# Patient Record
Sex: Female | Born: 1950
Health system: Southern US, Community
[De-identification: ages and names within clinical notes are randomized; demographics above are authoritative.]

## PROBLEM LIST (undated history)

## (undated) DIAGNOSIS — L309 Dermatitis, unspecified: Secondary | ICD-10-CM

## (undated) HISTORY — PX: COLONOSCOPY: SHX174

---

## 2009-06-26 ENCOUNTER — Encounter: Admission: RE | Admit: 2009-06-26 | Discharge: 2009-06-26 | Payer: Self-pay | Admitting: Family Medicine

## 2017-08-02 DIAGNOSIS — H2513 Age-related nuclear cataract, bilateral: Secondary | ICD-10-CM | POA: Diagnosis not present

## 2017-08-02 DIAGNOSIS — H353111 Nonexudative age-related macular degeneration, right eye, early dry stage: Secondary | ICD-10-CM | POA: Diagnosis not present

## 2017-08-02 DIAGNOSIS — H353221 Exudative age-related macular degeneration, left eye, with active choroidal neovascularization: Secondary | ICD-10-CM | POA: Diagnosis not present

## 2017-08-02 DIAGNOSIS — H43812 Vitreous degeneration, left eye: Secondary | ICD-10-CM | POA: Diagnosis not present

## 2018-04-04 DIAGNOSIS — H353222 Exudative age-related macular degeneration, left eye, with inactive choroidal neovascularization: Secondary | ICD-10-CM | POA: Diagnosis not present

## 2018-04-04 DIAGNOSIS — H2513 Age-related nuclear cataract, bilateral: Secondary | ICD-10-CM | POA: Diagnosis not present

## 2018-04-04 DIAGNOSIS — Z0100 Encounter for examination of eyes and vision without abnormal findings: Secondary | ICD-10-CM | POA: Diagnosis not present

## 2018-05-11 DIAGNOSIS — R69 Illness, unspecified: Secondary | ICD-10-CM | POA: Diagnosis not present

## 2018-06-27 DIAGNOSIS — L7211 Pilar cyst: Secondary | ICD-10-CM | POA: Diagnosis not present

## 2018-12-19 DIAGNOSIS — H353221 Exudative age-related macular degeneration, left eye, with active choroidal neovascularization: Secondary | ICD-10-CM | POA: Diagnosis not present

## 2018-12-19 DIAGNOSIS — H43813 Vitreous degeneration, bilateral: Secondary | ICD-10-CM | POA: Diagnosis not present

## 2018-12-19 DIAGNOSIS — H353111 Nonexudative age-related macular degeneration, right eye, early dry stage: Secondary | ICD-10-CM | POA: Diagnosis not present

## 2019-01-16 DIAGNOSIS — H353221 Exudative age-related macular degeneration, left eye, with active choroidal neovascularization: Secondary | ICD-10-CM | POA: Diagnosis not present

## 2019-04-26 DIAGNOSIS — L7212 Trichodermal cyst: Secondary | ICD-10-CM | POA: Diagnosis not present

## 2019-04-26 DIAGNOSIS — L7211 Pilar cyst: Secondary | ICD-10-CM | POA: Diagnosis not present

## 2019-11-07 ENCOUNTER — Other Ambulatory Visit: Payer: Self-pay

## 2019-11-07 DIAGNOSIS — Z1231 Encounter for screening mammogram for malignant neoplasm of breast: Secondary | ICD-10-CM

## 2020-02-13 DIAGNOSIS — H353222 Exudative age-related macular degeneration, left eye, with inactive choroidal neovascularization: Secondary | ICD-10-CM | POA: Diagnosis not present

## 2020-02-13 DIAGNOSIS — H2513 Age-related nuclear cataract, bilateral: Secondary | ICD-10-CM | POA: Diagnosis not present

## 2020-03-01 HISTORY — PX: EYE SURGERY: SHX253

## 2020-03-24 DIAGNOSIS — H2512 Age-related nuclear cataract, left eye: Secondary | ICD-10-CM | POA: Diagnosis not present

## 2020-03-24 DIAGNOSIS — H25043 Posterior subcapsular polar age-related cataract, bilateral: Secondary | ICD-10-CM | POA: Diagnosis not present

## 2020-03-24 DIAGNOSIS — H2513 Age-related nuclear cataract, bilateral: Secondary | ICD-10-CM | POA: Diagnosis not present

## 2020-03-24 DIAGNOSIS — H25013 Cortical age-related cataract, bilateral: Secondary | ICD-10-CM | POA: Diagnosis not present

## 2020-03-24 DIAGNOSIS — H18413 Arcus senilis, bilateral: Secondary | ICD-10-CM | POA: Diagnosis not present

## 2020-05-01 DIAGNOSIS — H2512 Age-related nuclear cataract, left eye: Secondary | ICD-10-CM | POA: Diagnosis not present

## 2020-05-01 DIAGNOSIS — H2511 Age-related nuclear cataract, right eye: Secondary | ICD-10-CM | POA: Diagnosis not present

## 2020-05-29 DIAGNOSIS — H2511 Age-related nuclear cataract, right eye: Secondary | ICD-10-CM | POA: Diagnosis not present

## 2020-05-29 DIAGNOSIS — H5211 Myopia, right eye: Secondary | ICD-10-CM | POA: Diagnosis not present

## 2020-06-11 DIAGNOSIS — R1010 Upper abdominal pain, unspecified: Secondary | ICD-10-CM | POA: Diagnosis not present

## 2020-06-11 DIAGNOSIS — R109 Unspecified abdominal pain: Secondary | ICD-10-CM | POA: Diagnosis not present

## 2020-06-11 DIAGNOSIS — Z23 Encounter for immunization: Secondary | ICD-10-CM | POA: Diagnosis not present

## 2020-06-15 ENCOUNTER — Other Ambulatory Visit: Payer: Self-pay | Admitting: Family Medicine

## 2020-06-15 DIAGNOSIS — R1084 Generalized abdominal pain: Secondary | ICD-10-CM

## 2020-06-16 ENCOUNTER — Other Ambulatory Visit: Payer: Self-pay

## 2020-06-16 ENCOUNTER — Ambulatory Visit (INDEPENDENT_AMBULATORY_CARE_PROVIDER_SITE_OTHER): Payer: Medicare HMO

## 2020-06-16 DIAGNOSIS — R109 Unspecified abdominal pain: Secondary | ICD-10-CM | POA: Diagnosis not present

## 2020-06-16 DIAGNOSIS — K8020 Calculus of gallbladder without cholecystitis without obstruction: Secondary | ICD-10-CM | POA: Diagnosis not present

## 2020-06-16 DIAGNOSIS — K8080 Other cholelithiasis without obstruction: Secondary | ICD-10-CM

## 2020-06-16 DIAGNOSIS — R1084 Generalized abdominal pain: Secondary | ICD-10-CM

## 2020-06-24 DIAGNOSIS — K8020 Calculus of gallbladder without cholecystitis without obstruction: Secondary | ICD-10-CM | POA: Diagnosis not present

## 2020-06-24 DIAGNOSIS — E782 Mixed hyperlipidemia: Secondary | ICD-10-CM | POA: Diagnosis not present

## 2020-06-30 DIAGNOSIS — Z0100 Encounter for examination of eyes and vision without abnormal findings: Secondary | ICD-10-CM | POA: Diagnosis not present

## 2020-07-01 DIAGNOSIS — K7689 Other specified diseases of liver: Secondary | ICD-10-CM

## 2020-07-01 HISTORY — DX: Other specified diseases of liver: K76.89

## 2020-07-23 DIAGNOSIS — K8020 Calculus of gallbladder without cholecystitis without obstruction: Secondary | ICD-10-CM | POA: Diagnosis not present

## 2020-07-28 ENCOUNTER — Other Ambulatory Visit: Payer: Self-pay | Admitting: Surgery

## 2020-07-28 ENCOUNTER — Other Ambulatory Visit (HOSPITAL_COMMUNITY): Payer: Self-pay | Admitting: Surgery

## 2020-07-28 DIAGNOSIS — K8020 Calculus of gallbladder without cholecystitis without obstruction: Secondary | ICD-10-CM

## 2020-08-10 ENCOUNTER — Ambulatory Visit (HOSPITAL_COMMUNITY)
Admission: RE | Admit: 2020-08-10 | Discharge: 2020-08-10 | Disposition: A | Discharge status: Home / Self Care | Payer: Medicare HMO | Source: Ambulatory Visit | Attending: Surgery | Admitting: Surgery

## 2020-08-10 ENCOUNTER — Other Ambulatory Visit: Payer: Self-pay

## 2020-08-10 DIAGNOSIS — Z8719 Personal history of other diseases of the digestive system: Secondary | ICD-10-CM | POA: Diagnosis not present

## 2020-08-10 DIAGNOSIS — K8020 Calculus of gallbladder without cholecystitis without obstruction: Secondary | ICD-10-CM | POA: Diagnosis not present

## 2020-08-10 DIAGNOSIS — R1011 Right upper quadrant pain: Secondary | ICD-10-CM | POA: Diagnosis not present

## 2020-08-10 MED ORDER — MORPHINE SULFATE (PF) 4 MG/ML IV SOLN: 3.0000 mg | Freq: Once | INTRAVENOUS | Status: DC

## 2020-08-10 MED ORDER — MORPHINE SULFATE (PF) 4 MG/ML IV SOLN
INTRAVENOUS | Status: AC
  Filled 2020-08-10: qty 1

## 2020-08-10 MED ORDER — MORPHINE BOLUS VIA INFUSION: 3.0000 mg | Freq: Once | INTRAVENOUS | Status: DC

## 2020-08-10 MED ORDER — TECHNETIUM TC 99M MEBROFENIN IV KIT
5.0000 | PACK | Freq: Once | INTRAVENOUS | Status: AC | PRN
  Administered 2020-08-10: 5 via INTRAVENOUS

## 2020-08-10 NOTE — Progress Notes (Signed)
Radiology RN called to Nuclear Medicine to administer 3 mg morphine IV.  BP prior to administration 99/55 HR 76. This RN called Dr. Clovis Riley with [facility]iology, notified of BP prior to morphine injection. Patient states her BP systolic is usually above 120s.  Medication not given at this time.  NM Techologist aware.

## 2020-08-12 ENCOUNTER — Ambulatory Visit: Payer: Self-pay | Admitting: Surgery

## 2020-08-13 NOTE — Pre-Procedure Instructions (Signed)
Your procedure is scheduled on Fri., Jan. 21, 2022 from 9:30AM-11:00AM.  Report to [facility] Entrance "A" at 7:30AM.  Call this number if you have problems the morning of surgery:  [phone]   Remember:  Do not eat or drink after midnight on Jan. 20th    Take these medicines the morning of surgery with A SIP OF WATER: NONE  As of today, STOP taking all Aspirin (unless instructed by your doctor) and Other Aspirin containing products, Vitamins, Fish oils, and Herbal medications. Also stop all NSAIDS i.e. Advil, Ibuprofen, Motrin, Aleve, Anaprox, Naproxen, BC, Goody Powders, and all Supplements.   No Smoking of any kind, Tobacco/Vaping, or Alcohol products 24 hours prior to your procedure. If you use a Cpap at night, you may bring all equipment for your overnight stay.     Do not wear jewelry, make-up or nail polish.  Do not wear lotions, powders, or perfumes, or deodorant.  Do not shave 48 hours prior to surgery.    Do not bring valuables to the hospital.  [facility] is not responsible for any belongings or valuables.  Contacts, dentures or bridgework may not be worn into surgery.    For patients admitted to the hospital, discharge time will be determined by your treatment team.  Patients discharged the day of surgery will not be allowed to drive home, and someone age 71 and over needs to stay with them for 24 hours.  Special instructions:  [facility]- Preparing For Surgery  Before surgery, you can play an important role. Because skin is not sterile, your skin needs to be as free of germs as possible. You can reduce the number of germs on your skin by washing with CHG (chlorahexidine gluconate) Soap before surgery.  CHG is an antiseptic cleaner which kills germs and bonds with the skin to continue killing germs even after washing.    Oral Hygiene is also important to reduce your risk of infection.  Remember - BRUSH YOUR TEETH THE MORNING OF SURGERY WITH YOUR REGULAR  TOOTHPASTE  Please do not use if you have an allergy to CHG or antibacterial soaps. If your skin becomes reddened/irritated stop using the CHG.  Do not shave (including legs and underarms) for at least 48 hours prior to first CHG shower. It is OK to shave your face.  Please follow these instructions carefully.   1. Shower the NIGHT BEFORE SURGERY and the MORNING OF SURGERY with CHG.   2. If you chose to wash your hair, wash your hair first as usual with your normal shampoo.  3. After you shampoo, rinse your hair and body thoroughly to remove the shampoo.  4. Use CHG as you would any other liquid soap. You can apply CHG directly to the skin and wash gently with a scrungie or a clean washcloth.   5. Apply the CHG Soap to your body ONLY FROM THE NECK DOWN.  Do not use on open wounds or open sores. Avoid contact with your eyes, ears, mouth and genitals (private parts). Wash Face and genitals (private parts)  with your normal soap.  6. Wash thoroughly, paying special attention to the area where your surgery will be performed.  7. Thoroughly rinse your body with warm water from the neck down.  8. DO NOT shower/wash with your normal soap after using and rinsing off the CHG Soap.  9. Pat yourself dry with a CLEAN TOWEL.  10. Wear CLEAN PAJAMAS to bed the night before surgery, wear comfortable  clothes the morning of surgery  11. Place CLEAN SHEETS on your bed the night of your first shower and DO NOT SLEEP WITH PETS.  Day of Surgery: Do not apply any deodorants/lotions.  Please wear clean clothes to the hospital/surgery center.   Remember to brush your teeth WITH YOUR REGULAR TOOTHPASTE.  Please read over the following fact sheets that you were given.

## 2020-08-14 ENCOUNTER — Encounter (HOSPITAL_COMMUNITY): Payer: Self-pay

## 2020-08-14 ENCOUNTER — Other Ambulatory Visit: Payer: Self-pay

## 2020-08-14 ENCOUNTER — Encounter (HOSPITAL_COMMUNITY)
Admission: RE | Admit: 2020-08-14 | Discharge: 2020-08-14 | Disposition: A | Discharge status: Home / Self Care | Payer: Medicare HMO | Source: Ambulatory Visit | Attending: Surgery | Admitting: Surgery

## 2020-08-14 DIAGNOSIS — K810 Acute cholecystitis: Secondary | ICD-10-CM | POA: Diagnosis not present

## 2020-08-14 DIAGNOSIS — Z87891 Personal history of nicotine dependence: Secondary | ICD-10-CM | POA: Diagnosis not present

## 2020-08-14 DIAGNOSIS — Z01812 Encounter for preprocedural laboratory examination: Secondary | ICD-10-CM | POA: Diagnosis not present

## 2020-08-14 HISTORY — DX: Dermatitis, unspecified: L30.9

## 2020-08-14 LAB — CBC
HCT: 44 % (ref 36.0–46.0)
Hemoglobin: 13.8 g/dL (ref 12.0–15.0)
MCH: 29.9 pg (ref 26.0–34.0)
MCHC: 31.4 g/dL (ref 30.0–36.0)
MCV: 95.2 fL (ref 80.0–100.0)
Platelets: 342 10*3/uL (ref 150–400)
RBC: 4.62 MIL/uL (ref 3.87–5.11)
RDW: 12.7 % (ref 11.5–15.5)
WBC: 9 10*3/uL (ref 4.0–10.5)
nRBC: 0 % (ref 0.0–0.2)

## 2020-08-14 NOTE — Progress Notes (Addendum)
Anesthesia Chart Review:  Case: [id number] Date/Time: 08/21/20 0915   Procedure: LAPAROSCOPIC CHOLECYSTECTOMY (N/A )   Anesthesia type: General   Pre-op diagnosis: acute cholecystitis   Location: [facility] OR ROOM 08 / [place] OR   Surgeons: Jesusita Oka, MD      DISCUSSION: Patient is a 70 year old female scheduled for the above procedure.  History includes former smoker, eczema, cholecystitis (with cystic duct obstruction 08/01/20), echogenic liver with 15 mm lesion (consider cyst 06/16/20 [place], consider [place] f/u in 6 months). BMI is consistent with obesity.   Preoperative CBC WNL. Unremarkable CMET at [facility] on 06/11/20.   Preoperative COVID-19 test is scheduled for 08/18/2020.  Anesthesia team to evaluate on the day of surgery.  I will plan to update note if any additional pertinent records received from [facility]. (UPDATE 08/19/20 12:00 PM: 06/24/20 primary care notes reviewed. Patient encouraged to follow through with general surgery evaluation for gallbladder disease. 08/18/20 COVID-19 test negative.)   VS: BP 122/81   Pulse 98   Temp 36.9 C (Oral)   Resp 19   Ht 5\' 1"  (1.549 m)   Wt 83.3 kg   SpO2 99%   BMI 34.71 kg/m    PROVIDERS: Kathyrn Lass, MD is PCP    LABS: Labs reviewed: Acceptable for surgery. She has comparison labs from 06/11/20 in [facility] with CMET then showing glucose 73, Cr 0.60, Na 139, K 4.2, Ca 9.7, TBil 0.5, ALP 89, AST 20, ALT 22, lipase 32. (all labs ordered are listed, but only abnormal results are displayed)  Labs Reviewed  CBC     IMAGES: NM Hepatobiliary Scan 08/10/20: IMPRESSION: 1. Nonvisualization of the gallbladder compatible with cystic duct obstruction due to cholecystitis. 2. These results will be called to the ordering clinician or representative by the Radiologist Assistant, and communication documented in the PACS or [employer].  [place] Abd RUQ 06/16/20: IMPRESSION: 1. Cholelithiasis. 2. Minimally dilated  common bile duct. 3. Echogenic liver, nonspecific though may reflect steatosis. 15 mm lesion in the right hepatic lobe which may reflect a mildly complex cyst. Consider follow-up right upper quadrant ultrasound in 6 months to assess stability.   EKG: N/A   CV: N/A  Past Medical History:  Diagnosis Date  . Eczema   . Liver cyst 07/2020   inconclusive via ultrasound    Past Surgical History:  Procedure Laterality Date  . COLONOSCOPY    . EYE SURGERY Bilateral 03/2020   cataract removal    MEDICATIONS: . Cholecalciferol (DIALYVITE VITAMIN D 5000) 125 MCG (5000 UT) capsule  . Multiple Vitamin (MULTIVITAMIN WITH MINERALS) TABS tablet  . naproxen sodium (ALEVE) 220 MG tablet   No current facility-administered medications for this encounter.   . morphine 4 MG/ML injection 3 mg    Myra Gianotti, PA-C Surgical Short Stay/Anesthesiology [facility] Phone [phone] [facility] Phone [phone] 08/14/2020 2:30 PM

## 2020-08-14 NOTE — Anesthesia Preprocedure Evaluation (Addendum)
Anesthesia Evaluation  Patient identified by MRN, date of birth, ID band Patient awake    Reviewed: Allergy & Precautions, H&P , NPO status , Patient's Chart, lab work & pertinent test results  Airway Mallampati: II   Neck ROM: full    Dental   Pulmonary former smoker,    breath sounds clear to auscultation       Cardiovascular negative cardio ROS   Rhythm:regular Rate:Normal     Neuro/Psych    GI/Hepatic Acute cholecystitis   Endo/Other  obese  Renal/GU      Musculoskeletal   Abdominal   Peds  Hematology   Anesthesia Other Findings   Reproductive/Obstetrics                            Anesthesia Physical Anesthesia Plan  ASA: II  Anesthesia Plan: General   Post-op Pain Management:    Induction: Intravenous  PONV Risk Score and Plan: 3 and Ondansetron, Dexamethasone and Treatment may vary due to age or medical condition  Airway Management Planned: Oral ETT  Additional Equipment:   Intra-op Plan:   Post-operative Plan: Extubation in OR  Informed Consent: I have reviewed the patients History and Physical, chart, labs and discussed the procedure including the risks, benefits and alternatives for the proposed anesthesia with the patient or authorized representative who has indicated his/her understanding and acceptance.     Dental advisory given  Plan Discussed with: CRNA, Anesthesiologist and Surgeon  Anesthesia Plan Comments: (PAT note written by Myra Gianotti, PA-C. )       Anesthesia Quick Evaluation

## 2020-08-14 NOTE — Progress Notes (Signed)
PCP - Kathyrn Lass, MD, with [facility] Per patient, she has not seen Dr. Sabra Heck in 5 years but will re-establish care this year. Records requested. Cardiologist - Denies  PPM/ICD - Denies  Chest x-ray - N/A EKG - N/A Stress Test - Denies ECHO - Denies Cardiac Cath - Denies  Sleep Study - Denies  Patient denies being diabetic.  Blood Thinner Instructions: N/A Aspirin Instructions: N/A Patient also instructed to stop taking naproxen sodium (ALEVE) as of today. Further instructed pt to reach out to Dr. Bobbye Morton for alternate prescription for pain relief.  ERAS Protcol - N/A PRE-SURGERY Ensure or G2- N/A  COVID TEST- 08/18/20   Anesthesia review: Yes, PCP records requested for review.  Patient denies shortness of breath, fever, cough and chest pain at PAT appointment   All instructions explained to the patient, with a verbal understanding of the material. Patient agrees to go over the instructions while at home for a better understanding. Patient also instructed to self quarantine after being tested for COVID-19. The opportunity to ask questions was provided.

## 2020-08-18 ENCOUNTER — Other Ambulatory Visit (HOSPITAL_COMMUNITY)
Admission: RE | Admit: 2020-08-18 | Discharge: 2020-08-18 | Disposition: A | Discharge status: Home / Self Care | Payer: Medicare HMO | Source: Ambulatory Visit | Attending: Surgery | Admitting: Surgery

## 2020-08-18 DIAGNOSIS — Z01818 Encounter for other preprocedural examination: Secondary | ICD-10-CM | POA: Insufficient documentation

## 2020-08-18 DIAGNOSIS — Z20822 Contact with and (suspected) exposure to covid-19: Secondary | ICD-10-CM | POA: Insufficient documentation

## 2020-08-18 LAB — SARS CORONAVIRUS 2 (TAT 6-24 HRS): SARS Coronavirus 2: NEGATIVE

## 2020-08-21 ENCOUNTER — Ambulatory Visit (HOSPITAL_COMMUNITY): Payer: Medicare HMO | Admitting: Vascular Surgery

## 2020-08-21 ENCOUNTER — Encounter (HOSPITAL_COMMUNITY)
Admission: RE | Disposition: A | Discharge status: Home / Self Care | Payer: Self-pay | Source: Home / Self Care | Attending: Surgery

## 2020-08-21 ENCOUNTER — Encounter (HOSPITAL_COMMUNITY): Payer: Self-pay | Admitting: Surgery

## 2020-08-21 ENCOUNTER — Inpatient Hospital Stay (HOSPITAL_COMMUNITY)
Admission: RE | Admit: 2020-08-21 | Discharge: 2020-09-01 | DRG: 418 | Disposition: A | Discharge status: Home / Self Care | Payer: Medicare HMO | Attending: Surgery | Admitting: Surgery

## 2020-08-21 ENCOUNTER — Other Ambulatory Visit: Payer: Self-pay

## 2020-08-21 DIAGNOSIS — Y839 Surgical procedure, unspecified as the cause of abnormal reaction of the patient, or of later complication, without mention of misadventure at the time of the procedure: Secondary | ICD-10-CM | POA: Diagnosis not present

## 2020-08-21 DIAGNOSIS — T8183XA Persistent postprocedural fistula, initial encounter: Secondary | ICD-10-CM | POA: Diagnosis not present

## 2020-08-21 DIAGNOSIS — K769 Liver disease, unspecified: Secondary | ICD-10-CM | POA: Diagnosis present

## 2020-08-21 DIAGNOSIS — N179 Acute kidney failure, unspecified: Secondary | ICD-10-CM | POA: Diagnosis not present

## 2020-08-21 DIAGNOSIS — K660 Peritoneal adhesions (postprocedural) (postinfection): Secondary | ICD-10-CM | POA: Diagnosis present

## 2020-08-21 DIAGNOSIS — J9690 Respiratory failure, unspecified, unspecified whether with hypoxia or hypercapnia: Secondary | ICD-10-CM

## 2020-08-21 DIAGNOSIS — E669 Obesity, unspecified: Secondary | ICD-10-CM | POA: Diagnosis present

## 2020-08-21 DIAGNOSIS — Z87891 Personal history of nicotine dependence: Secondary | ICD-10-CM

## 2020-08-21 DIAGNOSIS — C23 Malignant neoplasm of gallbladder: Secondary | ICD-10-CM | POA: Diagnosis not present

## 2020-08-21 DIAGNOSIS — K810 Acute cholecystitis: Secondary | ICD-10-CM | POA: Diagnosis not present

## 2020-08-21 DIAGNOSIS — Z6841 Body Mass Index (BMI) 40.0 and over, adult: Secondary | ICD-10-CM | POA: Diagnosis not present

## 2020-08-21 DIAGNOSIS — R109 Unspecified abdominal pain: Secondary | ICD-10-CM

## 2020-08-21 DIAGNOSIS — K828 Other specified diseases of gallbladder: Secondary | ICD-10-CM | POA: Diagnosis not present

## 2020-08-21 DIAGNOSIS — K567 Ileus, unspecified: Secondary | ICD-10-CM | POA: Diagnosis not present

## 2020-08-21 DIAGNOSIS — I96 Gangrene, not elsewhere classified: Secondary | ICD-10-CM | POA: Diagnosis not present

## 2020-08-21 DIAGNOSIS — Z9049 Acquired absence of other specified parts of digestive tract: Secondary | ICD-10-CM

## 2020-08-21 DIAGNOSIS — Z66 Do not resuscitate: Secondary | ICD-10-CM | POA: Diagnosis not present

## 2020-08-21 DIAGNOSIS — Y733 Surgical instruments, materials and gastroenterology and urology devices (including sutures) associated with adverse incidents: Secondary | ICD-10-CM | POA: Diagnosis not present

## 2020-08-21 DIAGNOSIS — Z888 Allergy status to other drugs, medicaments and biological substances status: Secondary | ICD-10-CM

## 2020-08-21 DIAGNOSIS — Z515 Encounter for palliative care: Secondary | ICD-10-CM

## 2020-08-21 DIAGNOSIS — C801 Malignant (primary) neoplasm, unspecified: Secondary | ICD-10-CM

## 2020-08-21 DIAGNOSIS — L02211 Cutaneous abscess of abdominal wall: Secondary | ICD-10-CM

## 2020-08-21 DIAGNOSIS — K316 Fistula of stomach and duodenum: Secondary | ICD-10-CM | POA: Diagnosis present

## 2020-08-21 DIAGNOSIS — Z7689 Persons encountering health services in other specified circumstances: Secondary | ICD-10-CM

## 2020-08-21 DIAGNOSIS — Z7189 Other specified counseling: Secondary | ICD-10-CM

## 2020-08-21 HISTORY — PX: CHOLECYSTECTOMY: SHX55

## 2020-08-21 LAB — COMPREHENSIVE METABOLIC PANEL
ALT: 86 U/L — ABNORMAL HIGH (ref 0–44)
AST: 104 U/L — ABNORMAL HIGH (ref 15–41)
Albumin: 2.9 g/dL — ABNORMAL LOW (ref 3.5–5.0)
Alkaline Phosphatase: 167 U/L — ABNORMAL HIGH (ref 38–126)
Anion gap: 12 (ref 5–15)
BUN: 10 mg/dL (ref 8–23)
CO2: 21 mmol/L — ABNORMAL LOW (ref 22–32)
Calcium: 8.7 mg/dL — ABNORMAL LOW (ref 8.9–10.3)
Chloride: 103 mmol/L (ref 98–111)
Creatinine, Ser: 0.67 mg/dL (ref 0.44–1.00)
GFR, Estimated: >60 mL/min (ref >60)
Glucose, Bld: 152 mg/dL — ABNORMAL HIGH (ref 70–99)
Potassium: 3.6 mmol/L (ref 3.5–5.1)
Sodium: 136 mmol/L (ref 135–145)
Total Bilirubin: 1.2 mg/dL (ref 0.3–1.2)
Total Protein: 6.7 g/dL (ref 6.5–8.1)

## 2020-08-21 LAB — CBC
HCT: 41 % (ref 36.0–46.0)
Hemoglobin: 13.3 g/dL (ref 12.0–15.0)
MCH: 29.7 pg (ref 26.0–34.0)
MCHC: 32.4 g/dL (ref 30.0–36.0)
MCV: 91.5 fL (ref 80.0–100.0)
Platelets: 302 10*3/uL (ref 150–400)
RBC: 4.48 MIL/uL (ref 3.87–5.11)
RDW: 12.4 % (ref 11.5–15.5)
WBC: 14 10*3/uL — ABNORMAL HIGH (ref 4.0–10.5)
nRBC: 0 % (ref 0.0–0.2)

## 2020-08-21 LAB — HIV ANTIBODY (ROUTINE TESTING W REFLEX): HIV Screen 4th Generation wRfx: NONREACTIVE

## 2020-08-21 SURGERY — LAPAROSCOPIC CHOLECYSTECTOMY
Anesthesia: General

## 2020-08-21 MED ORDER — ACETAMINOPHEN 500 MG PO TABS
1000.0000 mg | ORAL_TABLET | Freq: Four times a day (QID) | ORAL | Status: DC
  Administered 2020-08-21 – 2020-08-23 (×8): 1000 mg via ORAL
  Filled 2020-08-21 (×7): qty 2

## 2020-08-21 MED ORDER — CHLORHEXIDINE GLUCONATE 0.12 % MT SOLN
15.0000 mL | Freq: Once | OROMUCOSAL | Status: AC
  Administered 2020-08-21: 15 mL via OROMUCOSAL
  Filled 2020-08-21: qty 15

## 2020-08-21 MED ORDER — LIDOCAINE 2% (20 MG/ML) 5 ML SYRINGE
INTRAMUSCULAR | Status: DC | PRN
  Administered 2020-08-21: 50 mg via INTRAVENOUS

## 2020-08-21 MED ORDER — PROPOFOL 10 MG/ML IV BOLUS
INTRAVENOUS | Status: AC
  Filled 2020-08-21: qty 20

## 2020-08-21 MED ORDER — KETOROLAC TROMETHAMINE 15 MG/ML IJ SOLN
15.0000 mg | Freq: Four times a day (QID) | INTRAMUSCULAR | Status: DC
  Administered 2020-08-21: 15 mg via INTRAVENOUS
  Filled 2020-08-21: qty 1

## 2020-08-21 MED ORDER — DOCUSATE SODIUM 100 MG PO CAPS
100.0000 mg | ORAL_CAPSULE | Freq: Two times a day (BID) | ORAL | Status: DC
  Administered 2020-08-21 – 2020-08-23 (×4): 100 mg via ORAL
  Filled 2020-08-21 (×4): qty 1

## 2020-08-21 MED ORDER — FENTANYL CITRATE (PF) 250 MCG/5ML IJ SOLN
INTRAMUSCULAR | Status: AC
  Filled 2020-08-21: qty 5

## 2020-08-21 MED ORDER — LIDOCAINE 2% (20 MG/ML) 5 ML SYRINGE
INTRAMUSCULAR | Status: AC
  Filled 2020-08-21: qty 5

## 2020-08-21 MED ORDER — MIDAZOLAM HCL 5 MG/5ML IJ SOLN
INTRAMUSCULAR | Status: DC | PRN
  Administered 2020-08-21: 2 mg via INTRAVENOUS

## 2020-08-21 MED ORDER — METHOCARBAMOL 1000 MG/10ML IJ SOLN
1000.0000 mg | Freq: Three times a day (TID) | INTRAVENOUS | Status: DC
  Administered 2020-08-21 – 2020-08-22 (×3): 1000 mg via INTRAVENOUS
  Filled 2020-08-21 (×6): qty 10

## 2020-08-21 MED ORDER — OXYCODONE HCL 5 MG PO TABS
2.5000 mg | ORAL_TABLET | ORAL | Status: DC | PRN
  Administered 2020-08-21: 5 mg via ORAL
  Filled 2020-08-21 (×2): qty 1

## 2020-08-21 MED ORDER — SUCCINYLCHOLINE CHLORIDE 200 MG/10ML IV SOSY
PREFILLED_SYRINGE | INTRAVENOUS | Status: AC
  Filled 2020-08-21: qty 10

## 2020-08-21 MED ORDER — ACETAMINOPHEN 10 MG/ML IV SOLN
INTRAVENOUS | Status: DC | PRN
  Administered 2020-08-21: 1000 mg via INTRAVENOUS

## 2020-08-21 MED ORDER — FENTANYL CITRATE (PF) 100 MCG/2ML IJ SOLN
INTRAMUSCULAR | Status: DC | PRN
  Administered 2020-08-21: 50 ug via INTRAVENOUS
  Administered 2020-08-21: 25 ug via INTRAVENOUS
  Administered 2020-08-21: 50 ug via INTRAVENOUS
  Administered 2020-08-21: 25 ug via INTRAVENOUS
  Administered 2020-08-21: 50 ug via INTRAVENOUS

## 2020-08-21 MED ORDER — ORAL CARE MOUTH RINSE: 15.0000 mL | Freq: Once | OROMUCOSAL | Status: AC

## 2020-08-21 MED ORDER — PROPOFOL 10 MG/ML IV BOLUS
INTRAVENOUS | Status: DC | PRN
  Administered 2020-08-21: 120 mg via INTRAVENOUS

## 2020-08-21 MED ORDER — CHLORHEXIDINE GLUCONATE CLOTH 2 % EX PADS: 6.0000 | MEDICATED_PAD | Freq: Once | CUTANEOUS | Status: DC

## 2020-08-21 MED ORDER — MIDAZOLAM HCL 2 MG/2ML IJ SOLN
INTRAMUSCULAR | Status: AC
  Filled 2020-08-21: qty 2

## 2020-08-21 MED ORDER — ROCURONIUM BROMIDE 10 MG/ML (PF) SYRINGE
PREFILLED_SYRINGE | INTRAVENOUS | Status: DC | PRN
  Administered 2020-08-21: 50 mg via INTRAVENOUS

## 2020-08-21 MED ORDER — ONDANSETRON HCL 4 MG/2ML IJ SOLN
INTRAMUSCULAR | Status: DC | PRN
  Administered 2020-08-21: 4 mg via INTRAVENOUS

## 2020-08-21 MED ORDER — LACTATED RINGERS IV SOLN: INTRAVENOUS | Status: DC

## 2020-08-21 MED ORDER — FENTANYL CITRATE (PF) 100 MCG/2ML IJ SOLN
25.0000 ug | INTRAMUSCULAR | Status: DC | PRN
  Administered 2020-08-21 (×4): 25 ug via INTRAVENOUS

## 2020-08-21 MED ORDER — DEXAMETHASONE SODIUM PHOSPHATE 10 MG/ML IJ SOLN
INTRAMUSCULAR | Status: DC | PRN
  Administered 2020-08-21: 10 mg via INTRAVENOUS

## 2020-08-21 MED ORDER — BUPIVACAINE HCL (PF) 0.25 % IJ SOLN
INTRAMUSCULAR | Status: AC
  Filled 2020-08-21: qty 30

## 2020-08-21 MED ORDER — OXYCODONE HCL 5 MG/5ML PO SOLN: 5.0000 mg | Freq: Once | ORAL | Status: AC | PRN

## 2020-08-21 MED ORDER — SODIUM CHLORIDE 0.9 % IV SOLN
2.0000 g | INTRAVENOUS | Status: DC
  Administered 2020-08-21 – 2020-08-22 (×2): 2 g via INTRAVENOUS
  Filled 2020-08-21 (×2): qty 20

## 2020-08-21 MED ORDER — ENOXAPARIN SODIUM 40 MG/0.4ML ~~LOC~~ SOLN
40.0000 mg | SUBCUTANEOUS | Status: DC
  Administered 2020-08-22 – 2020-08-27 (×6): 40 mg via SUBCUTANEOUS
  Filled 2020-08-21 (×6): qty 0.4

## 2020-08-21 MED ORDER — ENOXAPARIN SODIUM 40 MG/0.4ML ~~LOC~~ SOLN
40.0000 mg | Freq: Once | SUBCUTANEOUS | Status: AC
  Administered 2020-08-21: 40 mg via SUBCUTANEOUS
  Filled 2020-08-21: qty 0.4

## 2020-08-21 MED ORDER — BUPIVACAINE HCL 0.25 % IJ SOLN
INTRAMUSCULAR | Status: DC | PRN
  Administered 2020-08-21: 10 mL
  Administered 2020-08-21: 20 mL

## 2020-08-21 MED ORDER — SODIUM CHLORIDE 0.9 % IR SOLN
Status: DC | PRN
  Administered 2020-08-21: 1000 mL

## 2020-08-21 MED ORDER — FENTANYL CITRATE (PF) 100 MCG/2ML IJ SOLN
INTRAMUSCULAR | Status: AC
  Filled 2020-08-21: qty 2

## 2020-08-21 MED ORDER — ONDANSETRON HCL 4 MG/2ML IJ SOLN: 4.0000 mg | Freq: Four times a day (QID) | INTRAMUSCULAR | Status: DC | PRN

## 2020-08-21 MED ORDER — ACETAMINOPHEN 10 MG/ML IV SOLN
INTRAVENOUS | Status: AC
  Filled 2020-08-21: qty 100

## 2020-08-21 MED ORDER — PHENYLEPHRINE 40 MCG/ML (10ML) SYRINGE FOR IV PUSH (FOR BLOOD PRESSURE SUPPORT)
PREFILLED_SYRINGE | INTRAVENOUS | Status: DC | PRN
  Administered 2020-08-21 (×3): 80 ug via INTRAVENOUS

## 2020-08-21 MED ORDER — OXYCODONE HCL 5 MG PO TABS
5.0000 mg | ORAL_TABLET | Freq: Once | ORAL | Status: AC | PRN
  Administered 2020-08-21: 5 mg via ORAL

## 2020-08-21 MED ORDER — KETOROLAC TROMETHAMINE 30 MG/ML IJ SOLN
INTRAMUSCULAR | Status: DC | PRN
  Administered 2020-08-21: 30 mg via INTRAVENOUS

## 2020-08-21 MED ORDER — ONDANSETRON 4 MG PO TBDP
4.0000 mg | ORAL_TABLET | Freq: Four times a day (QID) | ORAL | Status: DC | PRN
Start: 2020-08-21 — End: 2020-08-23

## 2020-08-21 MED ORDER — MORPHINE SULFATE (PF) 2 MG/ML IV SOLN
2.0000 mg | INTRAVENOUS | Status: DC | PRN
  Administered 2020-08-21 – 2020-08-22 (×4): 4 mg via INTRAVENOUS
  Filled 2020-08-21 (×4): qty 2

## 2020-08-21 MED ORDER — KETOROLAC TROMETHAMINE 15 MG/ML IJ SOLN
15.0000 mg | Freq: Four times a day (QID) | INTRAMUSCULAR | Status: DC
  Administered 2020-08-22 (×2): 15 mg via INTRAVENOUS
  Filled 2020-08-21 (×2): qty 1

## 2020-08-21 MED ORDER — ROCURONIUM BROMIDE 10 MG/ML (PF) SYRINGE
PREFILLED_SYRINGE | INTRAVENOUS | Status: AC
  Filled 2020-08-21: qty 10

## 2020-08-21 MED ORDER — KETOROLAC TROMETHAMINE 30 MG/ML IJ SOLN
INTRAMUSCULAR | Status: AC
  Filled 2020-08-21: qty 1

## 2020-08-21 MED ORDER — 0.9 % SODIUM CHLORIDE (POUR BTL) OPTIME
TOPICAL | Status: DC | PRN
  Administered 2020-08-21: 1000 mL

## 2020-08-21 MED ORDER — OXYCODONE HCL 5 MG PO TABS
ORAL_TABLET | ORAL | Status: AC
  Filled 2020-08-21: qty 1

## 2020-08-21 MED ORDER — SODIUM CHLORIDE 0.9 % IV SOLN
2.0000 g | INTRAVENOUS | Status: AC
  Administered 2020-08-21: 2 g via INTRAVENOUS
  Filled 2020-08-21: qty 2

## 2020-08-21 SURGICAL SUPPLY — 46 items
APPLIER CLIP 5 13 M/L LIGAMAX5 (MISCELLANEOUS) ×2
BLADE CLIPPER SURG (BLADE) IMPLANT
CANISTER SUCT 3000ML PPV (MISCELLANEOUS) ×2 IMPLANT
CHLORAPREP W/TINT 26 (MISCELLANEOUS) ×2 IMPLANT
CLIP APPLIE 5 13 M/L LIGAMAX5 (MISCELLANEOUS) ×1 IMPLANT
COVER SURGICAL LIGHT HANDLE (MISCELLANEOUS) ×2 IMPLANT
DERMABOND ADVANCED (GAUZE/BANDAGES/DRESSINGS) ×1
DERMABOND ADVANCED .7 DNX12 (GAUZE/BANDAGES/DRESSINGS) ×1 IMPLANT
DISSECTOR BLUNT TIP ENDO 5MM (MISCELLANEOUS) ×4 IMPLANT
DRAIN CHANNEL 19F RND (DRAIN) ×2 IMPLANT
DRSG TEGADERM 4X4.75 (GAUZE/BANDAGES/DRESSINGS) ×2 IMPLANT
ELECT CAUTERY BLADE 6.4 (BLADE) ×2 IMPLANT
ELECT REM PT RETURN 9FT ADLT (ELECTROSURGICAL) ×2
ELECTRODE REM PT RTRN 9FT ADLT (ELECTROSURGICAL) ×1 IMPLANT
EVACUATOR SILICONE 100CC (DRAIN) ×2 IMPLANT
GAUZE SPONGE 4X4 12PLY STRL (GAUZE/BANDAGES/DRESSINGS) ×2 IMPLANT
GLOVE BIO SURGEON STRL SZ 6.5 (GLOVE) ×2 IMPLANT
GLOVE BIOGEL PI IND STRL 6 (GLOVE) ×1 IMPLANT
GLOVE BIOGEL PI INDICATOR 6 (GLOVE) ×1
GOWN STRL REUS W/ TWL LRG LVL3 (GOWN DISPOSABLE) ×3 IMPLANT
GOWN STRL REUS W/TWL LRG LVL3 (GOWN DISPOSABLE) ×3
IV NS 1000ML (IV SOLUTION) ×1
IV NS 1000ML BAXH (IV SOLUTION) ×1 IMPLANT
KIT BASIN OR (CUSTOM PROCEDURE TRAY) ×2 IMPLANT
KIT TURNOVER KIT B (KITS) ×2 IMPLANT
NS IRRIG 1000ML POUR BTL (IV SOLUTION) ×2 IMPLANT
PAD ARMBOARD 7.5X6 YLW CONV (MISCELLANEOUS) ×4 IMPLANT
PENCIL BUTTON HOLSTER BLD 10FT (ELECTRODE) ×2 IMPLANT
POUCH SPECIMEN RETRIEVAL 10MM (ENDOMECHANICALS) ×2 IMPLANT
SCISSORS LAP 5X35 DISP (ENDOMECHANICALS) ×2 IMPLANT
SET IRRIG TUBING LAPAROSCOPIC (IRRIGATION / IRRIGATOR) ×2 IMPLANT
SET TUBE SMOKE EVAC HIGH FLOW (TUBING) ×2 IMPLANT
SLEEVE ENDOPATH XCEL 5M (ENDOMECHANICALS) ×4 IMPLANT
SPECIMEN JAR SMALL (MISCELLANEOUS) ×2 IMPLANT
SUT ETHILON 3 0 PS 1 (SUTURE) ×2 IMPLANT
SUT MNCRL AB 4-0 PS2 18 (SUTURE) ×2 IMPLANT
SUT VIC AB 0 UR5 27 (SUTURE) ×4 IMPLANT
SUT VICRYL 0 AB UR-6 (SUTURE) ×4 IMPLANT
SYR CONTROL 10ML LL (SYRINGE) ×2 IMPLANT
TOWEL GREEN STERILE (TOWEL DISPOSABLE) ×2 IMPLANT
TOWEL GREEN STERILE FF (TOWEL DISPOSABLE) ×2 IMPLANT
TRAY LAPAROSCOPIC MC (CUSTOM PROCEDURE TRAY) ×2 IMPLANT
TROCAR XCEL BLUNT TIP 100MML (ENDOMECHANICALS) ×2 IMPLANT
TROCAR XCEL NON-BLD 11X100MML (ENDOMECHANICALS) ×2 IMPLANT
TROCAR XCEL NON-BLD 5MMX100MML (ENDOMECHANICALS) ×2 IMPLANT
WATER STERILE IRR 1000ML POUR (IV SOLUTION) IMPLANT

## 2020-08-21 NOTE — Transfer of Care (Signed)
Immediate Anesthesia Transfer of Care Note  Patient: Faith Morgan  Procedure(s) Performed: LAPAROSCOPIC CHOLECYSTECTOMY, SUBTOTAL (N/A )  Patient Location: PACU  Anesthesia Type:General  Level of Consciousness: awake  Airway & Oxygen Therapy: Patient Spontanous Breathing and Patient connected to face mask oxygen  Post-op Assessment: Report given to RN and Post -op Vital signs reviewed and stable  Post vital signs: Reviewed and stable  Last Vitals:  Vitals Value Taken Time  BP 128/52 08/21/20 1212  Temp    Pulse 90 08/21/20 1215  Resp 19 08/21/20 1215  SpO2 97 % 08/21/20 1215  Vitals shown include unvalidated device data.  Last Pain:  Vitals:   08/21/20 0815  TempSrc:   PainSc: 3       Patients Stated Pain Goal: 3 (06/03/14 9458)  Complications: No complications documented.

## 2020-08-21 NOTE — Anesthesia Procedure Notes (Signed)
Procedure Name: Intubation Date/Time: 08/21/2020 9:59 AM Performed by: Hoy Morn, CRNA Pre-anesthesia Checklist: Patient identified, Emergency Drugs available, Suction available and Patient being monitored Patient Re-evaluated:Patient Re-evaluated prior to induction Oxygen Delivery Method: Circle system utilized Preoxygenation: Pre-oxygenation with 100% oxygen Induction Type: IV induction Ventilation: Mask ventilation without difficulty Laryngoscope Size: Miller and 2 Grade View: Grade II Tube type: Oral Tube size: 7.0 mm Number of attempts: 1 Airway Equipment and Method: Stylet and Oral airway Placement Confirmation: ETT inserted through vocal cords under direct vision,  positive ETCO2 and breath sounds checked- equal and bilateral Tube secured with: Tape Dental Injury: Teeth and Oropharynx as per pre-operative assessment

## 2020-08-21 NOTE — H&P (Signed)
    Nakesha Ebrahim is an 70 y.o. female.   HPI: 74F with RUQ pain, sent for o/p HIDA, dx with cholecystitis. Managed as o/p, here today for lap chole. The patient has had no hospitalizations, doctors visits, ER visits, surgeries, or newly diagnosed allergies since being seen in the office.    Past Medical History:  Diagnosis Date  . Eczema   . Liver cyst 07/2020   inconclusive via ultrasound    Past Surgical History:  Procedure Laterality Date  . COLONOSCOPY    . EYE SURGERY Bilateral 03/2020   cataract removal    History reviewed. No pertinent family history.  Social History:  reports that she has quit smoking. She has never used smokeless tobacco. She reports that she does not drink alcohol and does not use drugs.  Allergies:  Allergies  Allergen Reactions  . Crestor [Rosuvastatin]     Muscle pain    Medications: I have reviewed the patient's current medications.  No results found for this or any previous visit (from the past 48 hour(s)).  No results found.  ROS 10 point review of systems is negative except as listed above in HPI.   Physical Exam Blood pressure 104/72, pulse 95, temperature 98 F (36.7 C), temperature source Oral, resp. rate 18, height 5\' 1"  (1.549 m), weight 80.7 kg, SpO2 98 %. Constitutional: well-developed, well-nourished HEENT: pupils equal, round, reactive to light, 28mm b/l, moist conjunctiva, external inspection of ears and nose normal, hearing intact Oropharynx: normal oropharyngeal mucosa, normal dentition Neck: no thyromegaly, trachea midline, no midline cervical tenderness to palpation Chest: breath sounds equal bilaterally, normal respiratory effort, no midline or lateral chest wall tenderness to palpation/deformity Abdomen: soft, RUQ TTP, no bruising, no hepatosplenomegaly GU: normal female genitalia  Back: no wounds, no thoracic/lumbar spine tenderness to palpation, no thoracic/lumbar spine stepoffs Rectal: deferred Extremities:  2+ radial and pedal pulses bilaterally, motor and sensation intact to bilateral UE and LE, no peripheral edema MSK: normal gait/station, no clubbing/cyanosis of fingers/toes, normal ROM of all four extremities Skin: warm, dry, no rashes Psych: normal memory, normal mood/affect    Assessment/Plan: 74F with cholecystitis. Plan for lap chole. Informed consent was obtained after detailed explanation of risks, including bleeding, infection, biloma, need for IOC to delineate anatomy, and need for conversion to open procedure. All questions answered to the patient's satisfaction.   Jesusita Oka, MD General and [place] [facility]

## 2020-08-21 NOTE — Evaluation (Signed)
Physical Therapy Evaluation Patient Details Name: Faith Morgan MRN: [id number] DOB: 10/11/1950 Today's Date: 08/21/2020   History of Present Illness  Pt is a 70 y/o female s/p lap chole. No pertinent PMH.  Clinical Impression  Pt admitted secondary to problem above with deficits below. Pt requiring min A For bed mobility and min guard for short distance ambulation. Limited secondary to pain this session. Educated about importance of ambulation. Anticipate pt will progress well and not require PT follow up at d/c. Will continue to follow acutely.     Follow Up Recommendations No PT follow up    Equipment Recommendations  None recommended by PT    Recommendations for Other Services       Precautions / Restrictions Precautions Precautions: Other (comment) Precaution Comments: JP drain Restrictions Weight Bearing Restrictions: No      Mobility  Bed Mobility Overal bed mobility: Needs Assistance Bed Mobility: Supine to Sit     Supine to sit: Min assist     General bed mobility comments: Min A For trunk elevation.    Transfers Overall transfer level: Needs assistance Equipment used: None Transfers: Sit to/from Stand Sit to Stand: Min guard         General transfer comment: Min guard for safety. Increased time required secondary to pain  Ambulation/Gait Ambulation/Gait assistance: Min guard Gait Distance (Feet): 100 Feet Assistive device: IV Pole Gait Pattern/deviations: Step-through pattern;Decreased stride length Gait velocity: Decreased   General Gait Details: Very guarded gait secondary to pain, however, no LOB noted. Educated about importance of ambulation in recovery.  Stairs            Wheelchair Mobility    Modified Rankin (Stroke Patients Only)       Balance Overall balance assessment: Needs assistance Sitting-balance support: No upper extremity supported Sitting balance-Leahy Scale: Good     Standing balance support: Single  extremity supported;No upper extremity supported;During functional activity Standing balance-Leahy Scale: Fair Standing balance comment: able to maintain static standing without UE support                             Pertinent Vitals/Pain Pain Assessment: 0-10 Pain Score: 10-Worst pain ever Pain Location: abdomen Pain Descriptors / Indicators: Aching;Operative site guarding Pain Intervention(s): Limited activity within patient's tolerance;Monitored during session;Repositioned    Home Living Family/patient expects to be discharged to:: Private residence Living Arrangements: Spouse/significant other Available Help at Discharge: Family Type of Home: House Home Access: Stairs to enter Entrance Stairs-Rails: Psychiatric nurse of Steps: 5 Home Layout: Two level Home Equipment: None      Prior Function Level of Independence: Independent               Hand Dominance        Extremity/Trunk Assessment   Upper Extremity Assessment Upper Extremity Assessment: Defer to OT evaluation    Lower Extremity Assessment Lower Extremity Assessment: Overall WFL for tasks assessed    Cervical / Trunk Assessment Cervical / Trunk Assessment: Other exceptions Cervical / Trunk Exceptions: drain in abdomen  Communication   Communication: No difficulties  Cognition Arousal/Alertness: Awake/alert Behavior During Therapy: WFL for tasks assessed/performed Overall Cognitive Status: Within Functional Limits for tasks assessed                                        General Comments  Exercises     Assessment/Plan    PT Assessment Patient needs continued PT services  PT Problem List Decreased activity tolerance;Decreased mobility;Decreased knowledge of precautions;Decreased knowledge of use of DME;Pain       PT Treatment Interventions Gait training;Stair training;Functional mobility training;Therapeutic activities;Therapeutic  exercise;Patient/family education    PT Goals (Current goals can be found in the Care Plan section)  Acute Rehab PT Goals Patient Stated Goal: to decrease pain PT Goal Formulation: With patient Time For Goal Achievement: 09/04/20 Potential to Achieve Goals: Fair    Frequency Min 3X/week   Barriers to discharge        Co-evaluation               AM-PAC PT "6 Clicks" Mobility  Outcome Measure Help needed turning from your back to your side while in a flat bed without using bedrails?: A Little Help needed moving from lying on your back to sitting on the side of a flat bed without using bedrails?: A Little Help needed moving to and from a bed to a chair (including a wheelchair)?: A Little Help needed standing up from a chair using your arms (e.g., wheelchair or bedside chair)?: A Little Help needed to walk in hospital room?: A Little Help needed climbing 3-5 steps with a railing? : A Little 6 Click Score: 18    End of Session   Activity Tolerance: Patient limited by pain Patient left: in chair;with call bell/phone within reach Nurse Communication: Mobility status PT Visit Diagnosis: Other abnormalities of gait and mobility (R26.89);Pain Pain - part of body:  (abdomen)    Time: 2671-2458 PT Time Calculation (min) (ACUTE ONLY): 15 min   Charges:   PT Evaluation $PT Eval Low Complexity: 1 Low          Lou Miner, DPT  Acute Rehabilitation Services  Pager: [phone] Office: [phone]   Rudean Hitt 08/21/2020, 4:23 PM

## 2020-08-21 NOTE — Op Note (Signed)
   Operative Note  Date: 08/21/2020  Procedure: laparoscopic subtotal cholecystectomy with drain placement  Pre-op diagnosis: acute cholecystitis Post-op diagnosis: gangrenous, necrotic cholecystitis  Indication and clinical history: The patient is a 70 y.o. year old female with acute cholecystitis diagnosed on HIDA scan after presenting as an outpatient with b/l upper quadrant pain and back pain.  Surgeon: Jesusita Oka, MD Assistant: Grandville Silos, MD  Anesthesiologist: Marcie Bal, MD Anesthesia: General  Findings:  Specimen: gallstones EBL: 10cc Drains/Implants: 51F round JP drain, RUQ  Disposition: PACU - hemodynamically stable.  Description of procedure: The patient was positioned supine on the operating room table. Time-out was performed verifying correct patient, procedure, signature of informed consent, and administration of pre-operative antibiotics, VTE prophylaxis with low molecular weight heparin. General anesthetic induction and intubation were uneventful. The abdomen was prepped and draped in the usual sterile fashion. An infra-umbilical incision was made using an open technique using zero vicryl stay sutures on either side of the fascia and a 105mm Hassan port inserted. After establishing pneumoperitoneum, which the patient tolerated well, the abdominal cavity was inspected and no injury of any intra-abdominal structures was identified. Additional ports were placed under direct visualization and using local anesthetic: two 31mm ports in the right subcostal region and a 81mm port in the epigastric region. The patient was re-positioned to reverse Trendelenburg and right side up. There were dense adhesions precluding visualization of the gallbladder, including what appeared to be the duodenum densely adherent in the right upper quadrant. Careful dissection was performed. A small amount of pus was encountered during dissection and the gallbladder wall was eventually confirmed to be  completely necrotic and gray after visualizing the lumen of the gallbladder. The epigastric port was upsized to an 16mm port and a scooper used to extract all visualized stones. The infundibulum of the gallbladder was not able to be safely visualized and neither was the cystic duct or artery. It was felt that further dissection would result in vascular injury or duodenal injury and the decision was made to conclude dissection at this point. A drain was placed in the bed of the gallbladder after copious irrigation and brought out through the most lateral port site and secured. The fascia at the umbilicus and the epigastric ports were closed with zero vicryl suture in a running fashion with direct laparoscopic visualization. The abdomen was desufflated and the skin of all incisions was closed with 4-0 monocryl. Sterile dressings were applied. All sponge and instrument counts were correct at the conclusion of the procedure. The patient was awakened from anesthesia, extubated uneventfully, and transported to the PACU - hemodynamically stable.. There were no complications.   Jesusita Oka, MD General and [place] [facility]

## 2020-08-22 LAB — ALT: ALT: 64 U/L — ABNORMAL HIGH (ref 0–44)

## 2020-08-22 LAB — CBC
HCT: 50.2 % — ABNORMAL HIGH (ref 36.0–46.0)
Hemoglobin: 16.4 g/dL — ABNORMAL HIGH (ref 12.0–15.0)
MCH: 30.7 pg (ref 26.0–34.0)
MCHC: 32.7 g/dL (ref 30.0–36.0)
MCV: 94 fL (ref 80.0–100.0)
Platelets: 335 10*3/uL (ref 150–400)
RBC: 5.34 MIL/uL — ABNORMAL HIGH (ref 3.87–5.11)
RDW: 12.8 % (ref 11.5–15.5)
WBC: 17.9 10*3/uL — ABNORMAL HIGH (ref 4.0–10.5)
nRBC: 0 % (ref 0.0–0.2)

## 2020-08-22 LAB — COMPREHENSIVE METABOLIC PANEL
ALT: UNDETERMINED U/L (ref 0–44)
AST: 48 U/L — ABNORMAL HIGH (ref 15–41)
Albumin: 2.8 g/dL — ABNORMAL LOW (ref 3.5–5.0)
Alkaline Phosphatase: 156 U/L — ABNORMAL HIGH (ref 38–126)
Anion gap: 19 — ABNORMAL HIGH (ref 5–15)
BUN: 14 mg/dL (ref 8–23)
CO2: 15 mmol/L — ABNORMAL LOW (ref 22–32)
Calcium: 9.2 mg/dL (ref 8.9–10.3)
Chloride: 101 mmol/L (ref 98–111)
Creatinine, Ser: 1.25 mg/dL — ABNORMAL HIGH (ref 0.44–1.00)
GFR, Estimated: 46 mL/min — ABNORMAL LOW (ref >60)
Glucose, Bld: 198 mg/dL — ABNORMAL HIGH (ref 70–99)
Potassium: 3.5 mmol/L (ref 3.5–5.1)
Sodium: 135 mmol/L (ref 135–145)
Total Bilirubin: <0.1 mg/dL — ABNORMAL LOW (ref 0.3–1.2)
Total Protein: 6.8 g/dL (ref 6.5–8.1)

## 2020-08-22 MED ORDER — HYDROMORPHONE 1 MG/ML IV SOLN
INTRAVENOUS | Status: AC
  Administered 2020-08-22: 0.3 mg via INTRAVENOUS
  Administered 2020-08-22 – 2020-08-23 (×2): 30 mg via INTRAVENOUS
  Administered 2020-08-23: 0.6 mg via INTRAVENOUS
  Administered 2020-08-25: 30 mg via INTRAVENOUS
  Filled 2020-08-22 (×4): qty 30

## 2020-08-22 MED ORDER — NALOXONE HCL 0.4 MG/ML IJ SOLN: 0.4000 mg | INTRAMUSCULAR | Status: DC | PRN

## 2020-08-22 MED ORDER — SODIUM CHLORIDE 0.9% FLUSH: 9.0000 mL | INTRAVENOUS | Status: DC | PRN

## 2020-08-22 MED ORDER — OXYCODONE HCL 5 MG PO TABS: 5.0000 mg | ORAL_TABLET | ORAL | Status: DC | PRN

## 2020-08-22 MED ORDER — DIPHENHYDRAMINE HCL 12.5 MG/5ML PO ELIX
12.5000 mg | ORAL_SOLUTION | Freq: Four times a day (QID) | ORAL | Status: DC | PRN
  Filled 2020-08-22: qty 5

## 2020-08-22 MED ORDER — POTASSIUM CHLORIDE 20 MEQ PO PACK
40.0000 meq | PACK | Freq: Once | ORAL | Status: AC
  Administered 2020-08-22: 40 meq via ORAL
  Filled 2020-08-22: qty 2

## 2020-08-22 MED ORDER — METHOCARBAMOL 500 MG PO TABS
1000.0000 mg | ORAL_TABLET | Freq: Three times a day (TID) | ORAL | Status: DC
  Administered 2020-08-22 – 2020-08-23 (×3): 1000 mg via ORAL
  Filled 2020-08-22 (×3): qty 2

## 2020-08-22 MED ORDER — ONDANSETRON HCL 4 MG/2ML IJ SOLN
4.0000 mg | Freq: Four times a day (QID) | INTRAMUSCULAR | Status: DC | PRN
  Administered 2020-08-23 – 2020-08-26 (×2): 4 mg via INTRAVENOUS
  Filled 2020-08-22: qty 2

## 2020-08-22 MED ORDER — SODIUM CHLORIDE 0.9 % IV BOLUS
1000.0000 mL | Freq: Once | INTRAVENOUS | Status: AC
  Administered 2020-08-22: 1000 mL via INTRAVENOUS

## 2020-08-22 MED ORDER — HYDROMORPHONE HCL 1 MG/ML IJ SOLN
0.5000 mg | INTRAMUSCULAR | Status: DC | PRN
  Administered 2020-08-22: 1 mg via INTRAVENOUS
  Filled 2020-08-22: qty 1

## 2020-08-22 MED ORDER — DIPHENHYDRAMINE HCL 50 MG/ML IJ SOLN: 12.5000 mg | Freq: Four times a day (QID) | INTRAMUSCULAR | Status: DC | PRN

## 2020-08-22 MED ORDER — OXYCODONE HCL 5 MG PO TABS
5.0000 mg | ORAL_TABLET | ORAL | Status: DC | PRN
  Administered 2020-08-22: 10 mg via ORAL
  Filled 2020-08-22: qty 2

## 2020-08-22 NOTE — Progress Notes (Signed)
Patient having a lot of drainage from JP drain. MD notified and ordered to change JP to [facility] drain, "Bile Bag". Drain changed and drainage slowed down. Will continue to monitor.

## 2020-08-22 NOTE — Progress Notes (Signed)
Notified on-call provider Md Zenia Resides about patient's recent Mews score status change. She informed she would come and assess the patient. Will await additional orders.

## 2020-08-22 NOTE — Evaluation (Signed)
Occupational Therapy Evaluation Patient Details Name: Faith Morgan MRN: [id number] DOB: [birth year] Today's Date: 08/22/2020    History of Present Illness Pt is a 70 y/o female s/p lap chole due cholectystitis and necrotic gallbladder . No pertinent PMH.   Clinical Impression   Pt admitted with above. She demonstrates the below listed deficits and will benefit from continued OT to maximize safety and independence with BADLs.  Pt presents to OT with increased pain, decreased activity tolerance, and generalized weakness.  She currently requires min guard assist for functional transfers and extensive assist with ADLs due to increased pain.  She was only agreeable to BR transfers despite rationale for need for increased activity.  She returned to supine despite encouragement to ambulate and sit up in recliner.  She lives with spouse and was independent PTA. Will follow acutely.       Follow Up Recommendations  No OT follow up;Supervision/Assistance - 24 hour    Equipment Recommendations  Tub/shower seat    Recommendations for Other Services       Precautions / Restrictions Precautions Precautions: Other (comment) Precaution Comments: JP drain      Mobility Bed Mobility Overal bed mobility: Needs Assistance Bed Mobility: Sidelying to Sit;Rolling Rolling: Min guard Sidelying to sit: Min guard       General bed mobility comments: increaset time and effort.  heavy use of bedrails    Transfers Overall transfer level: Needs assistance Equipment used: None Transfers: Sit to/from [facility] Sit to Stand: Min guard Stand pivot transfers: Min assist       General transfer comment: mildly unsteady.  Held onto IV pole    Balance Overall balance assessment: Needs assistance Sitting-balance support: No upper extremity supported Sitting balance-Leahy Scale: Good     Standing balance support: Single extremity supported Standing balance-Leahy Scale:  Fair Standing balance comment: able to maintain static standing without UE support                           ADL either performed or assessed with clinical judgement   ADL Overall ADL's : Needs assistance/impaired Eating/Feeding: Set up;Bed level   Grooming: Wash/dry hands;Wash/dry face;Brushing hair;Set up;Bed level   Upper Body Bathing: Moderate assistance;Bed level   Lower Body Bathing: Maximal assistance;Sit to/from stand   Upper Body Dressing : Maximal assistance;Sitting   Lower Body Dressing: Total assistance;Sit to/from stand   Toilet Transfer: Min guard;Ambulation;Grab bars;Comfort height toilet   Toileting- Clothing Manipulation and Hygiene: Maximal assistance;Sit to/from stand Toileting - Clothing Manipulation Details (indicate cue type and reason): assist for clothing manipulation     Functional mobility during ADLs: Min guard;Rolling walker General ADL Comments: Pt requesting to use BR and voices significant frustration re: her inability to have BM.  encouraged pt to ambulate, engage in ADL tasks, and to sit up to wake up her bowels, but she just nodded and returned to bed     Vision         Perception     Praxis      Pertinent Vitals/Pain Pain Assessment: 0-10 Pain Score: 10-Worst pain ever Pain Location: abdomen Pain Descriptors / Indicators: Aching;Operative site guarding Pain Intervention(s): Monitored during session;Limited activity within patient's tolerance;Premedicated before session     Hand Dominance Right   Extremity/Trunk Assessment Upper Extremity Assessment Upper Extremity Assessment: Generalized weakness   Lower Extremity Assessment Lower Extremity Assessment: Defer to PT evaluation   Cervical / Trunk Assessment Cervical / Trunk  Assessment: Other exceptions Cervical / Trunk Exceptions: drain in abdomen   Communication Communication Communication: No difficulties (limited verbalizations)   Cognition Arousal/Alertness:  Awake/alert Behavior During Therapy: WFL for tasks assessed/performed Overall Cognitive Status: Within Functional Limits for tasks assessed                                 General Comments: grossly assessed   General Comments       Exercises     Shoulder Instructions      Home Living Family/patient expects to be discharged to:: Private residence Living Arrangements: Spouse/significant other Available Help at Discharge: Family Type of Home: House Home Access: Stairs to enter Technical brewer of Steps: 5 Entrance Stairs-Rails: Right;Left Home Layout: Two level Alternate Level Stairs-Number of Steps: flight Alternate Level Stairs-Rails: Right Bathroom Shower/Tub: Occupational psychologist: Standard     Home Equipment: None          Prior Functioning/Environment Level of Independence: Independent                 OT Problem List: Decreased strength;Decreased activity tolerance;Impaired balance (sitting and/or standing);Decreased knowledge of use of DME or AE;Decreased knowledge of precautions;Pain      OT Treatment/Interventions: Self-care/ADL training;DME and/or AE instruction;Therapeutic activities;Patient/family education;Balance training    OT Goals(Current goals can be found in the care plan section) Acute Rehab OT Goals Patient Stated Goal: to have BM OT Goal Formulation: With patient Time For Goal Achievement: 09/03/20 Potential to Achieve Goals: Good ADL Goals Pt Will Perform Grooming: with supervision;standing Pt Will Perform Upper Body Bathing: with supervision;sitting Pt Will Perform Lower Body Bathing: with supervision;sit to/from stand Pt Will Perform Upper Body Dressing: with supervision;sitting Pt Will Perform Lower Body Dressing: with supervision;sit to/from stand Pt Will Transfer to Toilet: with supervision;ambulating;regular height toilet;grab bars Pt Will Perform Toileting - Clothing Manipulation and hygiene: with  supervision;sit to/from stand  OT Frequency: Min 2X/week   Barriers to D/C:            Co-evaluation              AM-PAC OT "6 Clicks" Daily Activity     Outcome Measure Help from another person eating meals?: A Little Help from another person taking care of personal grooming?: A Little Help from another person toileting, which includes using toliet, bedpan, or urinal?: A Lot Help from another person bathing (including washing, rinsing, drying)?: A Lot Help from another person to put on and taking off regular upper body clothing?: A Lot Help from another person to put on and taking off regular lower body clothing?: Total 6 Click Score: 13   End of Session Nurse Communication: Mobility status  Activity Tolerance: Patient limited by pain Patient left: in bed;with call bell/phone within reach  OT Visit Diagnosis: Pain Pain - part of body:  (abdomen)                Time: 0950-1007 OT Time Calculation (min): 17 min Charges:  OT General Charges $OT Visit: 1 Visit OT Evaluation $OT Eval Moderate Complexity: 1 Mod  Girtha Kilgore C., OTR/L Acute Rehabilitation Services Pager [phone] Office [phone]   Lucille Passy M 08/22/2020, 10:25 AM

## 2020-08-22 NOTE — Progress Notes (Signed)
Patient ID: Faith Morgan, female   DOB: 01/25/1951, 70 y.o.   MRN: [id number] [facility] Surgery Progress Note:   1 Day Post-Op  Subjective: Mental status is fairly alert. .  Complaints sore but appropriate. Objective: Vital signs in last 24 hours: Temp:  [97.2 F (36.2 C)-98.3 F (36.8 C)] 98.3 F (36.8 C) (01/22 0735) Pulse Rate:  [69-89] 83 (01/22 0735) Resp:  [14-26] 18 (01/22 0735) BP: (112-143)/(45-81) 120/62 (01/22 0735) SpO2:  [91 %-100 %] 91 % (01/22 0735) FiO2 (%):  [21 %] 21 % (01/21 1215)  Intake/Output from previous day: 01/21 0701 - 01/22 0700 In: 1450 [I.V.:1150; IV Piggyback:300] Out: 990 [Drains:980; Blood:10] Intake/Output this shift: No intake/output data recorded.  Physical Exam: Work of breathing is normal.  JP with brownish green drainage.    Lab Results:  Results for orders placed or performed during the hospital encounter of 08/21/20 (from the past 48 hour(s))  HIV Antibody (routine testing w rflx)     Status: None   Collection Time: 08/21/20  2:54 PM  Result Value Ref Range   HIV Screen 4th Generation wRfx Non Reactive Non Reactive    Comment: Performed at [facility], [place] [address], [place], [place] [address]  CBC     Status: Abnormal   Collection Time: 08/21/20  2:54 PM  Result Value Ref Range   WBC 14.0 (H) 4.0 - 10.5 K/uL   RBC 4.48 3.87 - 5.11 MIL/uL   Hemoglobin 13.3 12.0 - 15.0 g/dL   HCT 41.0 36.0 - 46.0 %   MCV 91.5 80.0 - 100.0 fL   MCH 29.7 26.0 - 34.0 pg   MCHC 32.4 30.0 - 36.0 g/dL   RDW 12.4 11.5 - 15.5 %   Platelets 302 150 - 400 K/uL   nRBC 0.0 0.0 - 0.2 %    Comment: Performed at [facility], [place] [address] [place], [place] [address]  Comprehensive metabolic panel     Status: Abnormal   Collection Time: 08/21/20  2:54 PM  Result Value Ref Range   Sodium 136 135 - 145 mmol/L   Potassium 3.6 3.5 - 5.1 mmol/L   Chloride 103 98 - 111 mmol/L   CO2 21 (L) 22 - 32 mmol/L   Glucose, Bld 152 (H) 70 - 99  mg/dL    Comment: Glucose reference range applies only to samples taken after fasting for at least 8 hours.   BUN 10 8 - 23 mg/dL   Creatinine, Ser 0.67 0.44 - 1.00 mg/dL   Calcium 8.7 (L) 8.9 - 10.3 mg/dL   Total Protein 6.7 6.5 - 8.1 g/dL   Albumin 2.9 (L) 3.5 - 5.0 g/dL   AST 104 (H) 15 - 41 U/L   ALT 86 (H) 0 - 44 U/L   Alkaline Phosphatase 167 (H) 38 - 126 U/L   Total Bilirubin 1.2 0.3 - 1.2 mg/dL   GFR, Estimated >60 >60 mL/min    Comment: (NOTE) Calculated using the CKD-EPI Creatinine Equation (2021)    Anion gap 12 5 - 15    Comment: Performed at [facility], [place] [address], [place], [place] [address]    Radiology/Results: No results found.  Anti-infectives: Anti-infectives (From admission, onward)   Start     Dose/Rate Route Frequency Ordered Stop   08/21/20 1800  cefTRIAXone (ROCEPHIN) 2 g in sodium chloride 0.9 % 100 mL IVPB        2 g 200 mL/hr over 30  Minutes Intravenous Every 24 hours 08/21/20 1258     08/21/20 0745  cefoTEtan (CEFOTAN) 2 g in sodium chloride 0.9 % 100 mL IVPB        2 g 200 mL/hr over 30 Minutes Intravenous On call to O.R. 08/21/20 0739 08/21/20 1442      Assessment/Plan: Problem List: Patient Active Problem List   Diagnosis Date Noted  . S/P cholecystectomy 08/21/2020  . Encounter for cholecystectomy 08/21/2020   Had gangrenous appendectomy yesterday.  Pain better with Dilaudid.   1 Day Post-Op    LOS: 1 day   Matt B. Hassell Done, MD, [facility] [phone] to reach the surgeon on call.    08/22/2020 9:59 AM

## 2020-08-22 NOTE — Progress Notes (Signed)
After Md Zenia Resides assessed the patient she would transfer patient to a progressive care unit. Orders given to give another liter bolus as well as change the drainage bag back to a JP drain.  JP drain applied as well as bolus started. Will continue to monitor for patient safety.

## 2020-08-22 NOTE — Progress Notes (Signed)
PCA and IV NS Bolus order placed but patient's IV got infiltrated. IV team consulted as patient is a hard stick. IV team came but unsuccessful even with using a doppler. Will carry order as soon as IV line is started.

## 2020-08-22 NOTE — Progress Notes (Signed)
Called report to [facility] on 4N. Patient and belongings were safely transported to 4N.

## 2020-08-22 NOTE — Progress Notes (Signed)
Physical Therapy Treatment Patient Details Name: Faith Morgan MRN: [id number] DOB: 1950/10/04 Today's Date: 08/22/2020    History of Present Illness Pt is a 70 y/o female s/p lap chole due cholectystitis and necrotic gallbladder . No pertinent PMH.    PT Comments    Patient with increased pain this session. Patient requires min guard for mobility and use of IV pole for OOB mobility. Patient performed sit to stand x 3 from various surfaces with min guard. Patient continues to be limited by pain, decreased activity tolerance, impaired balance, generalized weakness. No PT follow up recommended is still appropriate at this time as mobility should progress once pain is managed.     Follow Up Recommendations  No PT follow up     Equipment Recommendations  None recommended by PT    Recommendations for Other Services       Precautions / Restrictions Precautions Precautions: Other (comment) Precaution Comments: JP drain Restrictions Weight Bearing Restrictions: No    Mobility  Bed Mobility Overal bed mobility: Needs Assistance Bed Mobility: Sidelying to Sit;Rolling Rolling: Min guard Sidelying to sit: Min guard       General bed mobility comments: increaset time and effort.  heavy use of bedrails  Transfers Overall transfer level: Needs assistance Equipment used: None Transfers: Sit to/from Stand Sit to Stand: Min guard Stand pivot transfers: Min assist       General transfer comment: mildly unsteady.  Held onto IV pole  Ambulation/Gait Ambulation/Gait assistance: Min guard Gait Distance (Feet): 60 Feet Assistive device: IV Pole Gait Pattern/deviations: Step-through pattern;Decreased stride length Gait velocity: Decreased   General Gait Details: Very guarded gait secondary to pain, however, no LOB noted.   Stairs             Wheelchair Mobility    Modified Rankin (Stroke Patients Only)       Balance Overall balance assessment: Needs  assistance Sitting-balance support: No upper extremity supported Sitting balance-Leahy Scale: Good     Standing balance support: Bilateral upper extremity supported;During functional activity Standing balance-Leahy Scale: Poor Standing balance comment: reliant on external suppport, reaches out for objects throughout ambulation                            Cognition Arousal/Alertness: Awake/alert Behavior During Therapy: WFL for tasks assessed/performed Overall Cognitive Status: Within Functional Limits for tasks assessed                                 General Comments: grossly assessed      Exercises      General Comments General comments (skin integrity, edema, etc.): Educated on importance of mobility with recovery, patient unreceptive due to pain      Pertinent Vitals/Pain Pain Assessment: 0-10 Pain Score: 10-Worst pain ever Pain Location: abdomen Pain Descriptors / Indicators: Aching;Operative site guarding;Moaning Pain Intervention(s): Monitored during session;Premedicated before session    Home Living Family/patient expects to be discharged to:: Private residence Living Arrangements: Spouse/significant other Available Help at Discharge: Family Type of Home: House Home Access: Stairs to enter Entrance Stairs-Rails: Right;Left Home Layout: Two level Home Equipment: None      Prior Function Level of Independence: Independent          PT Goals (current goals can now be found in the care plan section) Acute Rehab PT Goals Patient Stated Goal: to decrease pain PT Goal Formulation:  With patient Time For Goal Achievement: 09/04/20 Potential to Achieve Goals: Fair Progress towards PT goals: Progressing toward goals    Frequency    Min 3X/week      PT Plan Current plan remains appropriate    Co-evaluation              AM-PAC PT "6 Clicks" Mobility   Outcome Measure  Help needed turning from your back to your side while  in a flat bed without using bedrails?: A Little Help needed moving from lying on your back to sitting on the side of a flat bed without using bedrails?: A Little Help needed moving to and from a bed to a chair (including a wheelchair)?: A Little Help needed standing up from a chair using your arms (e.g., wheelchair or bedside chair)?: A Little Help needed to walk in hospital room?: A Little Help needed climbing 3-5 steps with a railing? : A Little 6 Click Score: 18    End of Session Equipment Utilized During Treatment: Gait belt Activity Tolerance: Patient limited by pain Patient left: in bed;with call bell/phone within reach Nurse Communication: Mobility status PT Visit Diagnosis: Other abnormalities of gait and mobility (R26.89);Pain Pain - part of body:  (abdomen)     Time: 5409-8119 PT Time Calculation (min) (ACUTE ONLY): 25 min  Charges:  $Therapeutic Activity: 23-37 mins                     Alayna A. Gilford Rile PT, DPT Acute Rehabilitation Services Pager [phone] Office [phone]    Alda Lea 08/22/2020, 11:04 AM

## 2020-08-22 NOTE — Progress Notes (Addendum)
I was notified that the patient has had a change in MEWS score. She is now tachycardic in the 110s but normotensive. On review of her chart she has had >1L of drainage from her surgical drain today. Labs this morning were significant for a metabolic acidosis with a bicarb of 15, and bump in creatinine from 0.6 to 1.25.  WBC is 17. Patient has had On exam the patient is alert and oriented. Abdomen is soft and appropriately tender. RUQ drain is to a bile bag with bilious contents in bag, and there is leakage of bilious fluid around the drain site.  Overall presentation is concerning for developing sepsis secondary to either an uncontrolled bile leak or a duodenal injury, as per op note the duodenum was adherent in the RUQ. Bile leak after subtotal cholecystectomy is common but output is very high. Patient received a bolus this afternoon. Will give another liter now and transfer to a monitored bed. Will also place drain to bulb suction to better evacuate intraabdominal fluid. Tachycardia and AKI may be secondary to dehydration from high drain output, but if patient does not currently have peritoneal signs and is overall clinically stable. If she were to worsen or develop peritonitis she may need exploration and washout for uncontrolled bile leak and to rule out a small bowel injury. Will continue to monitor closely tonight.  Michaelle Birks, MD [facility] Surgery General, Hepatobiliary and Pancreatic Surgery 08/22/20 9:16 PM

## 2020-08-23 ENCOUNTER — Observation Stay (HOSPITAL_COMMUNITY): Payer: Medicare HMO | Admitting: Anesthesiology

## 2020-08-23 ENCOUNTER — Observation Stay (HOSPITAL_COMMUNITY): Payer: Medicare HMO

## 2020-08-23 ENCOUNTER — Encounter (HOSPITAL_COMMUNITY)
Admission: RE | Disposition: A | Discharge status: Home / Self Care | Payer: Self-pay | Source: Home / Self Care | Attending: Surgery

## 2020-08-23 DIAGNOSIS — R188 Other ascites: Secondary | ICD-10-CM | POA: Diagnosis not present

## 2020-08-23 DIAGNOSIS — J90 Pleural effusion, not elsewhere classified: Secondary | ICD-10-CM | POA: Diagnosis not present

## 2020-08-23 DIAGNOSIS — N170 Acute kidney failure with tubular necrosis: Secondary | ICD-10-CM | POA: Diagnosis not present

## 2020-08-23 DIAGNOSIS — N179 Acute kidney failure, unspecified: Secondary | ICD-10-CM | POA: Diagnosis not present

## 2020-08-23 DIAGNOSIS — G893 Neoplasm related pain (acute) (chronic): Secondary | ICD-10-CM | POA: Diagnosis not present

## 2020-08-23 DIAGNOSIS — Y839 Surgical procedure, unspecified as the cause of abnormal reaction of the patient, or of later complication, without mention of misadventure at the time of the procedure: Secondary | ICD-10-CM | POA: Diagnosis not present

## 2020-08-23 DIAGNOSIS — I96 Gangrene, not elsewhere classified: Secondary | ICD-10-CM | POA: Diagnosis not present

## 2020-08-23 DIAGNOSIS — Z9049 Acquired absence of other specified parts of digestive tract: Secondary | ICD-10-CM | POA: Diagnosis not present

## 2020-08-23 DIAGNOSIS — Z6841 Body Mass Index (BMI) 40.0 and over, adult: Secondary | ICD-10-CM | POA: Diagnosis not present

## 2020-08-23 DIAGNOSIS — E669 Obesity, unspecified: Secondary | ICD-10-CM | POA: Diagnosis present

## 2020-08-23 DIAGNOSIS — J9811 Atelectasis: Secondary | ICD-10-CM | POA: Diagnosis not present

## 2020-08-23 DIAGNOSIS — M2550 Pain in unspecified joint: Secondary | ICD-10-CM | POA: Diagnosis not present

## 2020-08-23 DIAGNOSIS — T8183XA Persistent postprocedural fistula, initial encounter: Secondary | ICD-10-CM | POA: Diagnosis not present

## 2020-08-23 DIAGNOSIS — C23 Malignant neoplasm of gallbladder: Secondary | ICD-10-CM | POA: Diagnosis not present

## 2020-08-23 DIAGNOSIS — N281 Cyst of kidney, acquired: Secondary | ICD-10-CM | POA: Diagnosis not present

## 2020-08-23 DIAGNOSIS — J9690 Respiratory failure, unspecified, unspecified whether with hypoxia or hypercapnia: Secondary | ICD-10-CM | POA: Diagnosis not present

## 2020-08-23 DIAGNOSIS — R5381 Other malaise: Secondary | ICD-10-CM | POA: Diagnosis not present

## 2020-08-23 DIAGNOSIS — K660 Peritoneal adhesions (postprocedural) (postinfection): Secondary | ICD-10-CM | POA: Diagnosis not present

## 2020-08-23 DIAGNOSIS — R109 Unspecified abdominal pain: Secondary | ICD-10-CM | POA: Diagnosis not present

## 2020-08-23 DIAGNOSIS — Z7189 Other specified counseling: Secondary | ICD-10-CM | POA: Diagnosis not present

## 2020-08-23 DIAGNOSIS — K7689 Other specified diseases of liver: Secondary | ICD-10-CM | POA: Diagnosis not present

## 2020-08-23 DIAGNOSIS — K6389 Other specified diseases of intestine: Secondary | ICD-10-CM | POA: Diagnosis not present

## 2020-08-23 DIAGNOSIS — Y733 Surgical instruments, materials and gastroenterology and urology devices (including sutures) associated with adverse incidents: Secondary | ICD-10-CM | POA: Diagnosis not present

## 2020-08-23 DIAGNOSIS — Z7401 Bed confinement status: Secondary | ICD-10-CM | POA: Diagnosis not present

## 2020-08-23 DIAGNOSIS — Z888 Allergy status to other drugs, medicaments and biological substances status: Secondary | ICD-10-CM | POA: Diagnosis not present

## 2020-08-23 DIAGNOSIS — K821 Hydrops of gallbladder: Secondary | ICD-10-CM | POA: Diagnosis not present

## 2020-08-23 DIAGNOSIS — Z66 Do not resuscitate: Secondary | ICD-10-CM | POA: Diagnosis not present

## 2020-08-23 DIAGNOSIS — K915 Postcholecystectomy syndrome: Secondary | ICD-10-CM | POA: Diagnosis not present

## 2020-08-23 DIAGNOSIS — K810 Acute cholecystitis: Secondary | ICD-10-CM | POA: Diagnosis not present

## 2020-08-23 DIAGNOSIS — K316 Fistula of stomach and duodenum: Secondary | ICD-10-CM | POA: Diagnosis not present

## 2020-08-23 DIAGNOSIS — Z743 Need for continuous supervision: Secondary | ICD-10-CM | POA: Diagnosis not present

## 2020-08-23 DIAGNOSIS — K567 Ileus, unspecified: Secondary | ICD-10-CM | POA: Diagnosis not present

## 2020-08-23 DIAGNOSIS — K6811 Postprocedural retroperitoneal abscess: Secondary | ICD-10-CM | POA: Diagnosis not present

## 2020-08-23 DIAGNOSIS — Z452 Encounter for adjustment and management of vascular access device: Secondary | ICD-10-CM | POA: Diagnosis not present

## 2020-08-23 DIAGNOSIS — N1330 Unspecified hydronephrosis: Secondary | ICD-10-CM | POA: Diagnosis not present

## 2020-08-23 DIAGNOSIS — K769 Liver disease, unspecified: Secondary | ICD-10-CM | POA: Diagnosis present

## 2020-08-23 DIAGNOSIS — C801 Malignant (primary) neoplasm, unspecified: Secondary | ICD-10-CM | POA: Diagnosis not present

## 2020-08-23 DIAGNOSIS — Z85028 Personal history of other malignant neoplasm of stomach: Secondary | ICD-10-CM | POA: Diagnosis not present

## 2020-08-23 DIAGNOSIS — K750 Abscess of liver: Secondary | ICD-10-CM | POA: Diagnosis not present

## 2020-08-23 DIAGNOSIS — Z515 Encounter for palliative care: Secondary | ICD-10-CM | POA: Diagnosis not present

## 2020-08-23 DIAGNOSIS — Z87891 Personal history of nicotine dependence: Secondary | ICD-10-CM | POA: Diagnosis not present

## 2020-08-23 DIAGNOSIS — K838 Other specified diseases of biliary tract: Secondary | ICD-10-CM | POA: Diagnosis not present

## 2020-08-23 DIAGNOSIS — R000 Tachycardia, unspecified: Secondary | ICD-10-CM | POA: Diagnosis not present

## 2020-08-23 HISTORY — PX: LAPAROTOMY: SHX154

## 2020-08-23 HISTORY — PX: JEJUNOSTOMY: SHX313

## 2020-08-23 HISTORY — PX: GASTROSTOMY: SHX5249

## 2020-08-23 HISTORY — PX: GASTROJEJUNOSTOMY: SHX1697

## 2020-08-23 LAB — PHOSPHORUS: Phosphorus: 4 mg/dL (ref 2.5–4.6)

## 2020-08-23 LAB — COMPREHENSIVE METABOLIC PANEL
ALT: 46 U/L — ABNORMAL HIGH (ref 0–44)
AST: 40 U/L (ref 15–41)
Albumin: 2.1 g/dL — ABNORMAL LOW (ref 3.5–5.0)
Alkaline Phosphatase: 107 U/L (ref 38–126)
Anion gap: 10 (ref 5–15)
BUN: 22 mg/dL (ref 8–23)
CO2: 23 mmol/L (ref 22–32)
Calcium: 7.9 mg/dL — ABNORMAL LOW (ref 8.9–10.3)
Chloride: 99 mmol/L (ref 98–111)
Creatinine, Ser: 1.09 mg/dL — ABNORMAL HIGH (ref 0.44–1.00)
GFR, Estimated: 55 mL/min — ABNORMAL LOW (ref >60)
Glucose, Bld: 158 mg/dL — ABNORMAL HIGH (ref 70–99)
Potassium: 5.1 mmol/L (ref 3.5–5.1)
Sodium: 132 mmol/L — ABNORMAL LOW (ref 135–145)
Total Bilirubin: 1.6 mg/dL — ABNORMAL HIGH (ref 0.3–1.2)
Total Protein: 5.3 g/dL — ABNORMAL LOW (ref 6.5–8.1)

## 2020-08-23 LAB — CBC
HCT: 44 % (ref 36.0–46.0)
Hemoglobin: 15 g/dL (ref 12.0–15.0)
MCH: 31.1 pg (ref 26.0–34.0)
MCHC: 34.1 g/dL (ref 30.0–36.0)
MCV: 91.1 fL (ref 80.0–100.0)
Platelets: 419 10*3/uL — ABNORMAL HIGH (ref 150–400)
RBC: 4.83 MIL/uL (ref 3.87–5.11)
RDW: 12.8 % (ref 11.5–15.5)
WBC: 13.5 10*3/uL — ABNORMAL HIGH (ref 4.0–10.5)
nRBC: 0 % (ref 0.0–0.2)

## 2020-08-23 LAB — MAGNESIUM: Magnesium: 3.9 mg/dL — ABNORMAL HIGH (ref 1.7–2.4)

## 2020-08-23 SURGERY — LAPAROTOMY, EXPLORATORY
Anesthesia: General | Site: Abdomen

## 2020-08-23 MED ORDER — IOHEXOL 300 MG/ML  SOLN
100.0000 mL | Freq: Once | INTRAMUSCULAR | Status: AC | PRN
  Administered 2020-08-23: 100 mL via INTRAVENOUS

## 2020-08-23 MED ORDER — HYDROMORPHONE HCL 1 MG/ML IJ SOLN
INTRAMUSCULAR | Status: AC
  Filled 2020-08-23: qty 1

## 2020-08-23 MED ORDER — PHENYLEPHRINE HCL-NACL 10-0.9 MG/250ML-% IV SOLN
INTRAVENOUS | Status: DC | PRN
  Administered 2020-08-23: 25 ug/min via INTRAVENOUS

## 2020-08-23 MED ORDER — PHENYLEPHRINE 40 MCG/ML (10ML) SYRINGE FOR IV PUSH (FOR BLOOD PRESSURE SUPPORT)
PREFILLED_SYRINGE | INTRAVENOUS | Status: AC
  Filled 2020-08-23: qty 20

## 2020-08-23 MED ORDER — LIDOCAINE 2% (20 MG/ML) 5 ML SYRINGE
INTRAMUSCULAR | Status: DC | PRN
  Administered 2020-08-23: 60 mg via INTRAVENOUS

## 2020-08-23 MED ORDER — DEXAMETHASONE SODIUM PHOSPHATE 10 MG/ML IJ SOLN
INTRAMUSCULAR | Status: DC | PRN
  Administered 2020-08-23: 10 mg via INTRAVENOUS

## 2020-08-23 MED ORDER — IOHEXOL 9 MG/ML PO SOLN
500.0000 mL | ORAL | Status: AC
  Administered 2020-08-23 (×2): 500 mL via ORAL

## 2020-08-23 MED ORDER — POLYETHYLENE GLYCOL 3350 17 G PO PACK
17.0000 g | PACK | Freq: Every day | ORAL | Status: DC
  Administered 2020-08-23: 17 g via ORAL
  Filled 2020-08-23: qty 1

## 2020-08-23 MED ORDER — PIPERACILLIN-TAZOBACTAM 3.375 G IVPB
3.3750 g | Freq: Three times a day (TID) | INTRAVENOUS | Status: AC
  Administered 2020-08-23 – 2020-08-29 (×20): 3.375 g via INTRAVENOUS
  Filled 2020-08-23 (×20): qty 50

## 2020-08-23 MED ORDER — SODIUM CHLORIDE 0.9 % IV BOLUS
500.0000 mL | Freq: Once | INTRAVENOUS | Status: AC
  Administered 2020-08-23: 500 mL via INTRAVENOUS

## 2020-08-23 MED ORDER — FENTANYL CITRATE (PF) 250 MCG/5ML IJ SOLN
INTRAMUSCULAR | Status: AC
  Filled 2020-08-23: qty 5

## 2020-08-23 MED ORDER — FENTANYL CITRATE (PF) 100 MCG/2ML IJ SOLN: 25.0000 ug | INTRAMUSCULAR | Status: DC | PRN

## 2020-08-23 MED ORDER — HYDROMORPHONE HCL 1 MG/ML IJ SOLN
0.2500 mg | INTRAMUSCULAR | Status: DC
  Administered 2020-08-23 (×2): 0.5 mg via INTRAVENOUS

## 2020-08-23 MED ORDER — 0.9 % SODIUM CHLORIDE (POUR BTL) OPTIME
TOPICAL | Status: DC | PRN
  Administered 2020-08-23: 3000 mL

## 2020-08-23 MED ORDER — ALBUMIN HUMAN 5 % IV SOLN: INTRAVENOUS | Status: DC | PRN

## 2020-08-23 MED ORDER — SUGAMMADEX SODIUM 200 MG/2ML IV SOLN
INTRAVENOUS | Status: DC | PRN
  Administered 2020-08-23: 200 mg via INTRAVENOUS

## 2020-08-23 MED ORDER — BISACODYL 10 MG RE SUPP
10.0000 mg | Freq: Once | RECTAL | Status: DC
  Filled 2020-08-23: qty 1

## 2020-08-23 MED ORDER — LIDOCAINE 2% (20 MG/ML) 5 ML SYRINGE
INTRAMUSCULAR | Status: AC
  Filled 2020-08-23: qty 5

## 2020-08-23 MED ORDER — AMISULPRIDE (ANTIEMETIC) 5 MG/2ML IV SOLN: 10.0000 mg | Freq: Once | INTRAVENOUS | Status: DC | PRN

## 2020-08-23 MED ORDER — ORAL CARE MOUTH RINSE: 15.0000 mL | Freq: Once | OROMUCOSAL | Status: AC

## 2020-08-23 MED ORDER — METHOCARBAMOL 1000 MG/10ML IJ SOLN
500.0000 mg | Freq: Four times a day (QID) | INTRAVENOUS | Status: DC | PRN
  Filled 2020-08-23: qty 5

## 2020-08-23 MED ORDER — CHLORHEXIDINE GLUCONATE 0.12 % MT SOLN: 15.0000 mL | Freq: Once | OROMUCOSAL | Status: AC

## 2020-08-23 MED ORDER — CHLORHEXIDINE GLUCONATE 0.12 % MT SOLN
OROMUCOSAL | Status: AC
  Administered 2020-08-23: 15 mL via OROMUCOSAL
  Filled 2020-08-23: qty 15

## 2020-08-23 MED ORDER — PANTOPRAZOLE SODIUM 40 MG IV SOLR
40.0000 mg | Freq: Two times a day (BID) | INTRAVENOUS | Status: DC
  Administered 2020-08-23 – 2020-09-01 (×19): 40 mg via INTRAVENOUS
  Filled 2020-08-23 (×19): qty 40

## 2020-08-23 MED ORDER — FENTANYL CITRATE (PF) 100 MCG/2ML IJ SOLN
INTRAMUSCULAR | Status: DC | PRN
  Administered 2020-08-23: 25 ug via INTRAVENOUS
  Administered 2020-08-23 (×2): 50 ug via INTRAVENOUS
  Administered 2020-08-23: 100 ug via INTRAVENOUS
  Administered 2020-08-23: 25 ug via INTRAVENOUS

## 2020-08-23 MED ORDER — STERILE WATER FOR IRRIGATION IR SOLN
Status: DC | PRN
  Administered 2020-08-23: 1000 mL

## 2020-08-23 MED ORDER — PROPOFOL 10 MG/ML IV BOLUS
INTRAVENOUS | Status: DC | PRN
  Administered 2020-08-23: 120 mg via INTRAVENOUS

## 2020-08-23 MED ORDER — ROCURONIUM BROMIDE 10 MG/ML (PF) SYRINGE
PREFILLED_SYRINGE | INTRAVENOUS | Status: DC | PRN
  Administered 2020-08-23: 10 mg via INTRAVENOUS
  Administered 2020-08-23: 60 mg via INTRAVENOUS
  Administered 2020-08-23: 10 mg via INTRAVENOUS

## 2020-08-23 MED ORDER — ALBUTEROL SULFATE (2.5 MG/3ML) 0.083% IN NEBU
INHALATION_SOLUTION | RESPIRATORY_TRACT | Status: AC
  Filled 2020-08-23: qty 3

## 2020-08-23 MED ORDER — ACETAMINOPHEN 10 MG/ML IV SOLN
1000.0000 mg | Freq: Four times a day (QID) | INTRAVENOUS | Status: AC
  Administered 2020-08-23 – 2020-08-24 (×3): 1000 mg via INTRAVENOUS
  Filled 2020-08-23 (×4): qty 100

## 2020-08-23 MED ORDER — PROPOFOL 10 MG/ML IV BOLUS
INTRAVENOUS | Status: AC
  Filled 2020-08-23: qty 20

## 2020-08-23 MED ORDER — ALBUTEROL SULFATE (2.5 MG/3ML) 0.083% IN NEBU
2.5000 mg | INHALATION_SOLUTION | Freq: Once | RESPIRATORY_TRACT | Status: AC
  Administered 2020-08-23: 2.5 mg via RESPIRATORY_TRACT

## 2020-08-23 MED ORDER — BISACODYL 10 MG RE SUPP
10.0000 mg | Freq: Every day | RECTAL | Status: DC | PRN
  Filled 2020-08-23: qty 1

## 2020-08-23 MED ORDER — EPHEDRINE 5 MG/ML INJ
INTRAVENOUS | Status: AC
  Filled 2020-08-23: qty 10

## 2020-08-23 MED ORDER — LACTATED RINGERS IV SOLN: INTRAVENOUS | Status: DC

## 2020-08-23 MED ORDER — ROCURONIUM BROMIDE 10 MG/ML (PF) SYRINGE
PREFILLED_SYRINGE | INTRAVENOUS | Status: AC
  Filled 2020-08-23: qty 10

## 2020-08-23 SURGICAL SUPPLY — 60 items
BIOPATCH RED 1 DISK 7.0 (GAUZE/BANDAGES/DRESSINGS) ×10 IMPLANT
BNDG GAUZE ELAST 4 BULKY (GAUZE/BANDAGES/DRESSINGS) ×5 IMPLANT
CATH FOLEY 2WAY SLVR  5CC 14FR (CATHETERS) ×1
CATH FOLEY 2WAY SLVR 5CC 14FR (CATHETERS) ×4 IMPLANT
CATH GASTROSTOMY 20FR (CATHETERS) ×5 IMPLANT
CHLORAPREP W/TINT 26 (MISCELLANEOUS) ×5 IMPLANT
COVER SURGICAL LIGHT HANDLE (MISCELLANEOUS) ×5 IMPLANT
DRAIN CHANNEL 19F RND (DRAIN) ×5 IMPLANT
DRAPE LAPAROSCOPIC ABDOMINAL (DRAPES) ×5 IMPLANT
DRAPE LAPAROTOMY 100X72 PEDS (DRAPES) IMPLANT
DRAPE WARM FLUID 44X44 (DRAPES) ×5 IMPLANT
DRSG OPSITE POSTOP 4X10 (GAUZE/BANDAGES/DRESSINGS) IMPLANT
DRSG OPSITE POSTOP 4X8 (GAUZE/BANDAGES/DRESSINGS) IMPLANT
DRSG PAD ABDOMINAL 8X10 ST (GAUZE/BANDAGES/DRESSINGS) IMPLANT
DRSG TEGADERM 2-3/8X2-3/4 SM (GAUZE/BANDAGES/DRESSINGS) ×10 IMPLANT
ELECT BLADE 4.0 EZ CLEAN MEGAD (MISCELLANEOUS) ×5
ELECT BLADE 6.5 EXT (BLADE) IMPLANT
ELECT CAUTERY BLADE 6.4 (BLADE) ×5 IMPLANT
ELECT REM PT RETURN 9FT ADLT (ELECTROSURGICAL) ×5
ELECTRODE BLDE 4.0 EZ CLN MEGD (MISCELLANEOUS) ×4 IMPLANT
ELECTRODE REM PT RTRN 9FT ADLT (ELECTROSURGICAL) ×4 IMPLANT
EVACUATOR SILICONE 100CC (DRAIN) ×5 IMPLANT
GAUZE SPONGE 4X4 12PLY STRL (GAUZE/BANDAGES/DRESSINGS) ×5 IMPLANT
GLOVE BIO SURGEON STRL SZ 6 (GLOVE) ×20 IMPLANT
GLOVE SURG UNDER LTX SZ6.5 (GLOVE) ×5 IMPLANT
GOWN STRL REUS W/ TWL LRG LVL3 (GOWN DISPOSABLE) ×8 IMPLANT
GOWN STRL REUS W/TWL LRG LVL3 (GOWN DISPOSABLE) ×2
HANDLE SUCTION POOLE (INSTRUMENTS) ×4 IMPLANT
KIT BASIN OR (CUSTOM PROCEDURE TRAY) ×5 IMPLANT
KIT TUBE JEJUNAL 16FR (CATHETERS) ×5 IMPLANT
KIT TURNOVER KIT B (KITS) ×5 IMPLANT
LIGASURE IMPACT 36 18CM CVD LR (INSTRUMENTS) IMPLANT
NS IRRIG 1000ML POUR BTL (IV SOLUTION) ×15 IMPLANT
PACK GENERAL/GYN (CUSTOM PROCEDURE TRAY) ×5 IMPLANT
PAD ABD 8X10 STRL (GAUZE/BANDAGES/DRESSINGS) ×5 IMPLANT
PAD ARMBOARD 7.5X6 YLW CONV (MISCELLANEOUS) ×5 IMPLANT
PENCIL SMOKE EVACUATOR (MISCELLANEOUS) ×5 IMPLANT
RELOAD STAPLER LINE PROX 60 GR (STAPLE) ×4 IMPLANT
SHEARS HARMONIC ACE PLUS 36CM (ENDOMECHANICALS) ×5 IMPLANT
SPECIMEN JAR LARGE (MISCELLANEOUS) IMPLANT
SPONGE LAP 18X18 RF (DISPOSABLE) IMPLANT
SPONGE LAP 18X18 X RAY DECT (DISPOSABLE) ×5 IMPLANT
STAPLER PROXIMATE 75MM BLUE (STAPLE) ×5 IMPLANT
STAPLER RELOAD LINE PROX 60 GR (STAPLE) ×5
STAPLER VISISTAT 35W (STAPLE) ×5 IMPLANT
SUCTION POOLE HANDLE (INSTRUMENTS) ×5
SUT ETHILON 2 0 FS 18 (SUTURE) ×15 IMPLANT
SUT PDS AB 1 TP1 96 (SUTURE) ×10 IMPLANT
SUT PDS AB 4-0 RB1 27 (SUTURE) ×10 IMPLANT
SUT SILK 2 0 SH CR/8 (SUTURE) ×5 IMPLANT
SUT SILK 2 0 TIES 10X30 (SUTURE) ×5 IMPLANT
SUT SILK 3 0 SH CR/8 (SUTURE) ×15 IMPLANT
SUT SILK 3 0 TIES 10X30 (SUTURE) ×5 IMPLANT
SUT VIC AB 3-0 SH 18 (SUTURE) IMPLANT
SWAB COLLECTION DEVICE MRSA (MISCELLANEOUS) IMPLANT
SWAB CULTURE ESWAB REG 1ML (MISCELLANEOUS) IMPLANT
SYR CONTROL 10ML LL (SYRINGE) ×5 IMPLANT
TOWEL GREEN STERILE (TOWEL DISPOSABLE) ×5 IMPLANT
TOWEL GREEN STERILE FF (TOWEL DISPOSABLE) ×5 IMPLANT
WATER STERILE IRR 1000ML POUR (IV SOLUTION) ×5 IMPLANT

## 2020-08-23 NOTE — Transfer of Care (Signed)
Immediate Anesthesia Transfer of Care Note  Patient: Faith Morgan  Procedure(s) Performed: EXPLORATORY LAPAROTOMY; OMENTAL PEDICLE FLAP (N/A Abdomen) PYLORIC EXCLUSION; GASTROJEJUNOSTOMY (N/A Abdomen) INSERTION OF GASTROSTOMY TUBE (Left Abdomen) JEJUNOSTOMY (Abdomen)  Patient Location: PACU  Anesthesia Type:General  Level of Consciousness: drowsy and responds to stimulation  Airway & Oxygen Therapy: Patient Spontanous Breathing and Patient connected to nasal cannula oxygen  Post-op Assessment: Report given to RN and Post -op Vital signs reviewed and stable  Post vital signs: Reviewed and stable  Last Vitals:  Vitals Value Taken Time  BP 138/51 08/23/20 1716  Temp 36.2 C 08/23/20 1715  Pulse 96 08/23/20 1724  Resp 19 08/23/20 1724  SpO2 93 % 08/23/20 1724  Vitals shown include unvalidated device data.  Last Pain:  Vitals:   08/23/20 1715  TempSrc:   PainSc: Asleep      Patients Stated Pain Goal: 3 (38/33/38 3291)  Complications: No complications documented.

## 2020-08-23 NOTE — Progress Notes (Signed)
Pharmacy Antibiotic Note  Faith Morgan is a 70 y.o. female admitted on 08/21/2020 with intra-abdominal infection.  Pharmacy has been consulted for Zosyn dosing.  Plan: Zosyn 3.375g IV q8h (4 hour infusion).  Height: 5\' 1"  (154.9 cm) Weight: 80.7 kg (178 lb) IBW/kg (Calculated) : 47.8  Temp (24hrs), Avg:97.6 F (36.4 C), Min:97.5 F (36.4 C), Max:97.7 F (36.5 C)  Recent Labs  Lab 08/21/20 1454 08/22/20 1204 08/23/20 0402  WBC 14.0* 17.9* 13.5*  CREATININE 0.67 1.25* 1.09*    Estimated Creatinine Clearance: 46.2 mL/min (A) (by C-G formula based on SCr of 1.09 mg/dL (H)).    Allergies  Allergen Reactions  . Crestor [Rosuvastatin]     Muscle pain    Antimicrobials this admission:  Zosyn 1/23 >  CTX 1/21 > (1/24)  Dose adjustments this admission:  Microbiology results:  No Cx  Thank you for allowing pharmacy to be a part of this patient's care.  [name], [name], BCCP Clinical Pharmacist  08/23/2020 5:18 PM   [facility] pharmacy phone numbers are listed on [email or web address]

## 2020-08-23 NOTE — Op Note (Signed)
Operative Note  Ceria Suminski  [id number]  [id number]  08/23/2020   Surgeon: Victorino Sparrow ConnorMD  Assistant: Serita Grammes MD  Procedure performed: Exploratory laparotomy, abdominal washout, pyloric exclusion, retrocolic retrogastric gastrojejunostomy, gastrostomy tube placement, jejunostomy tube placement, omental pedicle flap coverage of large duodenal fistula, placement of additional intra-abdominal drains  Preop diagnosis: Duodenal fistula postop day 2 status post laparoscopic subtotal cholecystectomy Post-op diagnosis/intraop findings: Same, 2 cm defect on the anterior superior aspect of the first portion of the duodenum just distal to the pylorus  Specimens: No Retained items: Gastrostomy tube in the left upper quadrant, jejunostomy tube in the left mid abdomen, additional JP drain in the right mid abdomen inferior to the previous JP drain which was kept in place EBL: 50 cc Complications: none  Description of procedure: After obtaining informed consent the patient was taken to the operating room and placed supine on operating room table wheregeneral endotracheal anesthesia was initiated, preoperative antibiotics were administered, SCDs applied, and a formal timeout was performed.  Foley catheter is inserted. The abdomen was prepped and draped in usual sterile fashion and a midline laparotomy was created.  Bilious succus was aspirated from the abdomen on entry.  The Bookwalter retractor was placed and the right upper quadrant inspected first.  The gallbladder fossa appears hemostatic, there is a large defect in the anterior superior aspect of the D1 just distal to the pylorus, this is approximately 2 cm in diameter with ongoing bilious drainage.  I attempted to close this transversely but the tissues are too friable.  The distal stomach just proximal to the pylorus was circumferentially dissected and then a TX 60 green load stapler was fired across this area to perform a pyloric  exclusion.  The lesser sac was entered and the omentum dissected away from the greater curve of the stomach for several centimeters using the harmonic scalpel.  A site for the gastrojejunostomy was identified on the posterior distal stomach.  She has a very large boggy colon mesentery and a very large boggy omentum with a short small bowel mesentery and the small bowel would not reach over the omentum and colon therefore a defect was made in the transverse colon mesentery and the bare area just lateral to the vessels and a loop of small bowel approximately 40 cm from ligament of Treitz was brought up through this defect and sutured to the stomach with 3-0 silk stay sutures.  The afferent/biliary limb lies to the patient's left and the efferent/alimentary limb lies to the patient's right.  A gastrotomy and enterotomy were made with the Bovie and then a blue load 75 mm Endo GIA stapler was used to create our gastrojejunostomy.  The apex of the staple line was reinforced with a 3-0 silk.  The common enterotomy was closed with a running 4-0 PDS starting at either end and tying centrally.  This was oversewn with interrupted seromuscular 3-0 silks.  A site on the mid stomach proximal to the gastrojejunostomy was identified for G-tube placement and a pursestring of 3-0 silk was placed.  A 20 French MIC gastrostomy tube was brought through the left upper quadrant abdominal wall and introduced through a gastrotomy in the center of this pursestring.  The balloon was inflated with 8 cc of sterile water and the pursestring was tied down.  The stomach was then sutured to the abdominal wall and 4 points surrounding the gastrostomy tube to create a Stamm gastrostomy tube.  The flange is secured to the skin with  3 interrupted 2 oh nylons.  This will be placed to gravity.  We then followed the small bowel distally from the gastrojejunostomy approximately 40 cm and identified a site that would easily reach the abdominal wall.  A  3-0 silk pursestring suture was placed in an enterotomy made. The jejunostomy tube was brought through a stab incision on the left mid abdominal wall and inserted and then the jejunostomy tube was directed distally.  The pursestring is tied down and the balloon on the J-tube is inflated with about 2 cc of water.  A Witzel tunnel was created with interrupted seromuscular 3-0 silk extending about 4 cm proximally.  The jejunum was then sutured to the abdominal wall with several interrupted 3 oh silks for a distance of about 6 cm in order to minimize twisting or torquing around the tube.  The flange was then sutured to the skin with 3 interrupted 2-0 nylons.  At this point the pedicle of omentum was separated from the remaining omentum using the harmonic scalpel and is well vascularized omental pedicle flap was brought up to the right upper quadrant where it sits without tension over the gallbladder fossa and duodenal fistula.  This was secured to the distal stomach pyloric region with interrupted 3 oh silks.  The previous surgical drain in the right upper quadrant was trimmed and this is placed in the gallbladder fossa/adjacent to the omental pedicle flap overlying the duodenal fistula.  An additional 47 French round Blake drain was introduced to the right midabdomen and this is directed down along the right colic gutter into the pelvis.  This is secured to the skin with a 2-0 nylon.  The abdomen was irrigated with warm sterile saline and the effluent was clear.  The fascia at this point was closed with running looped #1 PDS starting at either end and tying centrally.  A damp to dry dressing was applied.  The patient was then awakened, extubated and taken to PACU in stable condition.  Her daughter Maudie Mercury was updated at the end of the case.  All counts were correct at the completion of the case.

## 2020-08-23 NOTE — Progress Notes (Signed)
Patient examined, chart reviewed and CT images reviewed and discussed with Dr. Kathlene Cote.  Uncontrolled duodenal fistula status post subtotal cholecystectomy.  Surgical drain has flipped out of the gallbladder bed above the liver dome. Recommend laparotomy for washout, additional drain placement, possible G-tube, J-tube, G-tube and possible pyloric exclusion with gastrojejunostomy depending on the anatomy and what is accessible within the inflammatory process.  I discussed the relevant anatomy and surgical plans with the patient as well as her daughter Faith Morgan at the bedside.  Questions welcomed and answered.  We will proceed to the OR this afternoon.

## 2020-08-23 NOTE — OR Nursing (Signed)
PROXIMATE STAPLER #[id number]. XBD#[id number], EXP. 11/28/21 USED BY SURGEON DURING CASE.

## 2020-08-23 NOTE — Progress Notes (Signed)
Pt transferred from [facility] for telemetry monitoring.  Pt on PCA and reporting pain at 1.  Pt just urinated before coming to the floor.  Pt oriented to unit protocol and RN answered all patient questions and concerns.  Bed in lowest position and call light within reach.

## 2020-08-23 NOTE — Progress Notes (Signed)
General Surgery Follow Up Note  Subjective:    Overnight Issues:   Objective:  Vital signs for last 24 hours: Temp:  [97 F (36.1 C)-98.3 F (36.8 C)] 97.7 F (36.5 C) (01/23 0708) Pulse Rate:  [83-117] 95 (01/23 0708) Resp:  [17-26] 20 (01/23 0708) BP: (86-134)/(47-93) 134/77 (01/23 0708) SpO2:  [91 %-98 %] 95 % (01/23 0708) FiO2 (%):  [21 %] 21 % (01/22 2213)  Hemodynamic parameters for last 24 hours:    Intake/Output from previous day: 01/22 0701 - 01/23 0700 In: 2121.9 [P.O.:240; I.V.:866.3; IV Piggyback:1015.6] Out: 1750 [Drains:1750]  Intake/Output this shift: No intake/output data recorded.  Vent settings for last 24 hours: FiO2 (%):  [21 %] 21 %  Physical Exam:  Gen: comfortable, no distress Neuro: non-focal exam HEENT: PERRL Neck: supple CV: RRR Pulm: unlabored breathing Abd: soft, appropriately TTP, incisions c/d/i, JP site with bilious drainage around it, bilious JP o/p GU: clear yellow urine Extr: wwp, no edema   Results for orders placed or performed during the hospital encounter of 08/21/20 (from the past 24 hour(s))  Comprehensive metabolic panel     Status: Abnormal   Collection Time: 08/22/20 12:04 PM  Result Value Ref Range   Sodium 135 135 - 145 mmol/L   Potassium 3.5 3.5 - 5.1 mmol/L   Chloride 101 98 - 111 mmol/L   CO2 15 (L) 22 - 32 mmol/L   Glucose, Bld 198 (H) 70 - 99 mg/dL   BUN 14 8 - 23 mg/dL   Creatinine, Ser 1.25 (H) 0.44 - 1.00 mg/dL   Calcium 9.2 8.9 - 10.3 mg/dL   Total Protein 6.8 6.5 - 8.1 g/dL   Albumin 2.8 (L) 3.5 - 5.0 g/dL   AST 48 (H) 15 - 41 U/L   ALT QUANTITY NOT SUFFICIENT, UNABLE TO PERFORM TEST 0 - 44 U/L   Alkaline Phosphatase 156 (H) 38 - 126 U/L   Total Bilirubin <0.1 (L) 0.3 - 1.2 mg/dL   GFR, Estimated 46 (L) >60 mL/min   Anion gap 19 (H) 5 - 15  CBC     Status: Abnormal   Collection Time: 08/22/20 12:04 PM  Result Value Ref Range   WBC 17.9 (H) 4.0 - 10.5 K/uL   RBC 5.34 (H) 3.87 - 5.11 MIL/uL    Hemoglobin 16.4 (H) 12.0 - 15.0 g/dL   HCT 50.2 (H) 36.0 - 46.0 %   MCV 94.0 80.0 - 100.0 fL   MCH 30.7 26.0 - 34.0 pg   MCHC 32.7 30.0 - 36.0 g/dL   RDW 12.8 11.5 - 15.5 %   Platelets 335 150 - 400 K/uL   nRBC 0.0 0.0 - 0.2 %  ALT     Status: Abnormal   Collection Time: 08/22/20  4:59 PM  Result Value Ref Range   ALT 64 (H) 0 - 44 U/L  CBC     Status: Abnormal   Collection Time: 08/23/20  4:02 AM  Result Value Ref Range   WBC 13.5 (H) 4.0 - 10.5 K/uL   RBC 4.83 3.87 - 5.11 MIL/uL   Hemoglobin 15.0 12.0 - 15.0 g/dL   HCT 44.0 36.0 - 46.0 %   MCV 91.1 80.0 - 100.0 fL   MCH 31.1 26.0 - 34.0 pg   MCHC 34.1 30.0 - 36.0 g/dL   RDW 12.8 11.5 - 15.5 %   Platelets 419 (H) 150 - 400 K/uL   nRBC 0.0 0.0 - 0.2 %    Assessment & Plan:  The plan of care was discussed with the bedside nurse for the day, who is in agreement with this plan and no additional concerns were raised.   Present on Admission: **None**    LOS: 1 day   Additional comments:I reviewed the patient's new clinical lab test results.   and I reviewed the patients new imaging test results.    S/p lap subtotal cholecystectomy for necrotic cholecystitis - POD2, drainage has changed to bilious, plan for HIDA and possible ERCP. Will also order CT A/P with PO contrast to r/o duodenal injury.  Pain control - PCA added yest, pain improved, okay to take oxy to reduce PCA needs, but cont PCA for now, encourage activity/OOB now that pain is better controlled FEN - mild, expected ileus, okay to continue clears as tolerated, not having nausea, may advance if she feels more hungry, add miralax, suppository, and prune juice today. Await today's CMP to assess renal function and lytes. DVT - SCDs, LMWH Dispo - 4NP   Jesusita Oka, MD Trauma & General Surgery Please use [email or web address] to contact on call provider  08/23/2020  *Care during the described time interval was provided by me. I have reviewed this patient's available data,  including medical history, events of note, physical examination and test results as part of my evaluation.

## 2020-08-23 NOTE — Progress Notes (Signed)
Occupational Therapy Treatment Patient Details Name: Faith Morgan MRN: [id number] DOB: 19-Nov-1950 Today's Date: 08/23/2020    History of present illness Pt is a 70 y/o female s/p lap chole due cholectystitis and necrotic gallbladder . No pertinent PMH.   OT comments  Pt. Seen for skilled OT treatment session. Pt. Motivated to participate however session limited secondary to pending procedure and wound drainage issues.  Pt. Completed bed mobility min a and was able to stand for bedding change and also side step x2 min guard a.  Will continue to progress adls next session.     Follow Up Recommendations  No OT follow up;Supervision/Assistance - 24 hour    Equipment Recommendations  Tub/shower seat    Recommendations for Other Services      Precautions / Restrictions Precautions Precautions: Other (comment) Precaution Comments: JP drain Restrictions Weight Bearing Restrictions: No       Mobility Bed Mobility Overal bed mobility: Needs Assistance Bed Mobility: Rolling;Sidelying to Sit;Sit to Sidelying Rolling: Min assist Sidelying to sit: Min guard     Sit to sidelying: Min assist General bed mobility comments: increaset time and effort.  heavy use of bedrails, initial support from bed pad to help start her roll, no other physical assistance for oob. required assistance to bring each leg into bed but able to reposition on her own  Transfers                      Balance                                           ADL either performed or assessed with clinical judgement   ADL Overall ADL's : Needs assistance/impaired                                       General ADL Comments: pt. reports she has been to/from b.room 2x this day with assistance from nursing staff.  motivated to particpate today, however session limited secondary to large amount of wound drainage in bed and pt. needed dressing change, this paired with pending  procedure.  session focused on bed mobility, sit/stands, and repositioning in bed     Vision       Perception     Praxis      Cognition Arousal/Alertness: Awake/alert Behavior During Therapy: WFL for tasks assessed/performed Overall Cognitive Status: Within Functional Limits for tasks assessed                                 General Comments: tearful when talking about the sudden loss of her mother. provided emotional support and helped with redirecting the conversation when pt. was ready        Exercises     Shoulder Instructions       General Comments  pt. Has 2 dogs that she adores.  Lots of pics and videos. Has a dtr. Also with dogs.     Pertinent Vitals/ Pain       Pain Assessment: Faces Pain Location: abdomen Pain Descriptors / Indicators: Aching;Operative site guarding  Home Living  Prior Functioning/Environment              Frequency  Min 2X/week        Progress Toward Goals  OT Goals(current goals can now be found in the care plan section)  Progress towards OT goals: Progressing toward goals     Plan Discharge plan remains appropriate    Co-evaluation                 AM-PAC OT "6 Clicks" Daily Activity     Outcome Measure   Help from another person eating meals?: A Little Help from another person taking care of personal grooming?: A Little Help from another person toileting, which includes using toliet, bedpan, or urinal?: A Lot Help from another person bathing (including washing, rinsing, drying)?: A Lot Help from another person to put on and taking off regular upper body clothing?: A Lot Help from another person to put on and taking off regular lower body clothing?: Total 6 Click Score: 13    End of Session    OT Visit Diagnosis: Pain   Activity Tolerance Patient tolerated treatment well   Patient Left in bed;with call bell/phone within reach;with  nursing/sitter in room   Nurse Communication Other (comment) (rn states ok to work with pt. but procedure pending. also alerted rn large amount of drainage covering bed pad and through bandage. cna came in to assess and also reviewed with rn)        Time: 8768-1157 OT Time Calculation (min): 21 min  Charges: OT General Charges $OT Visit: 1 Visit OT Treatments $Self Care/Home Management : 8-22 mins  Sonia Baller, COTA/L Acute Rehabilitation [phone]   Janice Coffin 08/23/2020, 11:37 AM

## 2020-08-23 NOTE — Anesthesia Procedure Notes (Signed)
Procedure Name: Intubation Date/Time: 08/23/2020 2:00 PM Performed by: Lavell Luster, CRNA Pre-anesthesia Checklist: Patient identified, Emergency Drugs available, Suction available, Patient being monitored and Timeout performed Patient Re-evaluated:Patient Re-evaluated prior to induction Oxygen Delivery Method: Circle system utilized Preoxygenation: Pre-oxygenation with 100% oxygen Induction Type: IV induction Ventilation: Mask ventilation without difficulty Laryngoscope Size: Mac and 3 Grade View: Grade II Tube type: Oral Tube size: 7.5 mm Number of attempts: 1 Airway Equipment and Method: Stylet Placement Confirmation: ETT inserted through vocal cords under direct vision,  positive ETCO2 and breath sounds checked- equal and bilateral Secured at: 22 cm Tube secured with: Tape Dental Injury: Teeth and Oropharynx as per pre-operative assessment

## 2020-08-23 NOTE — Anesthesia Preprocedure Evaluation (Signed)
Anesthesia Evaluation  Patient identified by MRN, date of birth, ID band Patient awake    Reviewed: Allergy & Precautions, H&P , NPO status , Patient's Chart, lab work & pertinent test results  Airway Mallampati: II   Neck ROM: full    Dental   Pulmonary former smoker,    breath sounds clear to auscultation       Cardiovascular negative cardio ROS   Rhythm:regular Rate:Normal     Neuro/Psych negative neurological ROS     GI/Hepatic Neg liver ROS, Free air, abdominal abscess?   Endo/Other  negative endocrine ROSobese  Renal/GU ARFRenal disease     Musculoskeletal   Abdominal   Peds  Hematology negative hematology ROS (+)   Anesthesia Other Findings   Reproductive/Obstetrics                             Lab Results  Component Value Date   WBC 13.5 (H) 08/23/2020   HGB 15.0 08/23/2020   HCT 44.0 08/23/2020   MCV 91.1 08/23/2020   PLT 419 (H) 08/23/2020   Lab Results  Component Value Date   CREATININE 1.09 (H) 08/23/2020   BUN 22 08/23/2020   NA 132 (L) 08/23/2020   K 5.1 08/23/2020   CL 99 08/23/2020   CO2 23 08/23/2020    Anesthesia Physical  Anesthesia Plan  ASA: III and emergent  Anesthesia Plan: General   Post-op Pain Management:    Induction: Intravenous  PONV Risk Score and Plan: 3 and Ondansetron, Dexamethasone and Treatment may vary due to age or medical condition  Airway Management Planned: Oral ETT  Additional Equipment:   Intra-op Plan:   Post-operative Plan: Possible Post-op intubation/ventilation  Informed Consent: I have reviewed the patients History and Physical, chart, labs and discussed the procedure including the risks, benefits and alternatives for the proposed anesthesia with the patient or authorized representative who has indicated his/her understanding and acceptance.     Dental advisory given  Plan Discussed with: CRNA  Anesthesia  Plan Comments: ( )        Anesthesia Quick Evaluation

## 2020-08-23 NOTE — Anesthesia Postprocedure Evaluation (Signed)
Anesthesia Post Note  Patient: Faith Morgan  Procedure(s) Performed: EXPLORATORY LAPAROTOMY; OMENTAL PEDICLE FLAP (N/A Abdomen) PYLORIC EXCLUSION; GASTROJEJUNOSTOMY (N/A Abdomen) INSERTION OF GASTROSTOMY TUBE (Left Abdomen) JEJUNOSTOMY (Abdomen)     Patient location during evaluation: PACU Anesthesia Type: General Level of consciousness: awake and alert Pain management: pain level controlled Vital Signs Assessment: post-procedure vital signs reviewed and stable Respiratory status: spontaneous breathing, nonlabored ventilation, respiratory function stable and patient connected to nasal cannula oxygen Cardiovascular status: blood pressure returned to baseline and stable Postop Assessment: no apparent nausea or vomiting Anesthetic complications: no   No complications documented.  Last Vitals:  Vitals:   08/23/20 1815 08/23/20 1846  BP: (!) 145/53   Pulse: 95   Resp: 20 18  Temp: 36.5 C   SpO2: 93% 93%    Last Pain:  Vitals:   08/23/20 1846  TempSrc:   PainSc: 8                  Tiajuana Amass

## 2020-08-23 NOTE — Progress Notes (Addendum)
Pt requested to keep cell phone and charger at bedside.   Pt transferred to Short Stay with no complications.

## 2020-08-23 NOTE — Progress Notes (Signed)
Pt PCA held per OR requirement. RN [place] witness.

## 2020-08-24 ENCOUNTER — Encounter (HOSPITAL_COMMUNITY): Payer: Self-pay | Admitting: Surgery

## 2020-08-24 ENCOUNTER — Inpatient Hospital Stay: Payer: Self-pay

## 2020-08-24 LAB — CBC
HCT: 37 % (ref 36.0–46.0)
Hemoglobin: 12 g/dL (ref 12.0–15.0)
MCH: 30 pg (ref 26.0–34.0)
MCHC: 32.4 g/dL (ref 30.0–36.0)
MCV: 92.5 fL (ref 80.0–100.0)
Platelets: 353 10*3/uL (ref 150–400)
RBC: 4 MIL/uL (ref 3.87–5.11)
RDW: 12.9 % (ref 11.5–15.5)
WBC: 12.1 10*3/uL — ABNORMAL HIGH (ref 4.0–10.5)
nRBC: 0 % (ref 0.0–0.2)

## 2020-08-24 LAB — GLUCOSE, CAPILLARY
Glucose-Capillary: 111 mg/dL — ABNORMAL HIGH (ref 70–99)
Glucose-Capillary: 120 mg/dL — ABNORMAL HIGH (ref 70–99)

## 2020-08-24 LAB — COMPREHENSIVE METABOLIC PANEL
ALT: 25 U/L (ref 0–44)
AST: 22 U/L (ref 15–41)
Albumin: 2 g/dL — ABNORMAL LOW (ref 3.5–5.0)
Alkaline Phosphatase: 71 U/L (ref 38–126)
Anion gap: 7 (ref 5–15)
BUN: 24 mg/dL — ABNORMAL HIGH (ref 8–23)
CO2: 24 mmol/L (ref 22–32)
Calcium: 7.5 mg/dL — ABNORMAL LOW (ref 8.9–10.3)
Chloride: 100 mmol/L (ref 98–111)
Creatinine, Ser: 0.91 mg/dL (ref 0.44–1.00)
GFR, Estimated: >60 mL/min (ref >60)
Glucose, Bld: 132 mg/dL — ABNORMAL HIGH (ref 70–99)
Potassium: 4.8 mmol/L (ref 3.5–5.1)
Sodium: 131 mmol/L — ABNORMAL LOW (ref 135–145)
Total Bilirubin: 0.7 mg/dL (ref 0.3–1.2)
Total Protein: 4.9 g/dL — ABNORMAL LOW (ref 6.5–8.1)

## 2020-08-24 LAB — SURGICAL PATHOLOGY

## 2020-08-24 LAB — PHOSPHORUS: Phosphorus: 3.7 mg/dL (ref 2.5–4.6)

## 2020-08-24 LAB — MAGNESIUM: Magnesium: 2.8 mg/dL — ABNORMAL HIGH (ref 1.7–2.4)

## 2020-08-24 MED ORDER — CHLORHEXIDINE GLUCONATE CLOTH 2 % EX PADS
6.0000 | MEDICATED_PAD | Freq: Every day | CUTANEOUS | Status: DC
  Administered 2020-08-24 – 2020-08-31 (×8): 6 via TOPICAL

## 2020-08-24 MED ORDER — INSULIN ASPART 100 UNIT/ML ~~LOC~~ SOLN
0.0000 [IU] | Freq: Four times a day (QID) | SUBCUTANEOUS | Status: DC
  Administered 2020-08-25 (×2): 3 [IU] via SUBCUTANEOUS

## 2020-08-24 MED ORDER — SODIUM CHLORIDE 0.9 % IV SOLN: INTRAVENOUS | Status: AC

## 2020-08-24 MED ORDER — TRACE MINERALS CU-MN-SE-ZN 300-55-60-3000 MCG/ML IV SOLN
INTRAVENOUS | Status: DC
  Filled 2020-08-24: qty 592.8

## 2020-08-24 MED ORDER — TRACE MINERALS CU-MN-SE-ZN 300-55-60-3000 MCG/ML IV SOLN
INTRAVENOUS | Status: AC
  Filled 2020-08-24: qty 319.2

## 2020-08-24 MED ORDER — SODIUM CHLORIDE 0.9% FLUSH
10.0000 mL | Freq: Two times a day (BID) | INTRAVENOUS | Status: DC
  Administered 2020-08-24 – 2020-08-30 (×11): 10 mL
  Administered 2020-08-30: 20 mL
  Administered 2020-08-31 – 2020-09-01 (×3): 10 mL

## 2020-08-24 MED ORDER — TRACE MINERALS CU-MN-SE-ZN 300-55-60-3000 MCG/ML IV SOLN: INTRAVENOUS | Status: DC

## 2020-08-24 MED ORDER — SODIUM CHLORIDE 0.9% FLUSH: 10.0000 mL | INTRAVENOUS | Status: DC | PRN

## 2020-08-24 NOTE — Progress Notes (Addendum)
Initial Nutrition Assessment  DOCUMENTATION CODES:   Obesity unspecified  INTERVENTION:  TPN per Pharmacy.   If nutrition via J-tube is able and warranted, recommend Osmolite 1.5 formula and advance as tolerated to goal rate of 50 ml/hr with 45 ml Prosource TF BID to provide 1880 kcal, 97 grams of protein, and 912 ml of free water.   NUTRITION DIAGNOSIS:   Inadequate oral intake related to inability to eat as evidenced by NPO status.  GOAL:   Patient will meet greater than or equal to 90% of their needs  MONITOR:   Skin,Weight trends,Labs,I & O's,Other (Comment) (TPN)  REASON FOR ASSESSMENT:   Consult New TPN/TNA  ASSESSMENT:   89 yof presenting 1/21 with cholecystitis. 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 S/p laparotomy for washout, additional drain placement, G-tube, G-tube, pyloric exclusion with gastrojejunostomy on 1/23.   1/21- laparoscopic subtotal cholecystectomy with drain placement 1/23- s/p ex lap, abdominal washout, pyloric exclusion, retrocolic retrogastric gastrojejunostomy, gastrostomy tube placement, jejunostomy tube placement, omental pedicle flap coverage of large duodenal fistula, placement of additional intra-abdominal drains  Plans for TPN initiation today. Pt with uncontrolled duodenal fistula. Pt requesting RD to revisit her tomorrow. Unable to obtain pt nutrition history today. Will plan to revisit for full nutrition assessment.   Unable to complete Nutrition-Focused physical exam at this time.   Labs and medications reviewed.   Diet Order:   Diet Order            Diet NPO time specified  Diet effective now                 EDUCATION NEEDS:   Not appropriate for education at this time  Skin:  Skin Assessment: Skin Integrity Issues: Skin Integrity Issues:: Incisions Incisions: abdomen  Last BM:  1/19  Height:   Ht Readings from Last 1 Encounters:  08/21/20 5\' 1"  (1.549 m)    Weight:    Wt Readings from Last 1 Encounters:  08/21/20 80.7 kg    BMI:  Body mass index is 33.63 kg/m.  Estimated Nutritional Needs:   Kcal:  1800-2000  Protein:  90-100 grams  Fluid:  >/= 1.8 L/day  Corrin Parker, MS, RD, LDN RD pager number/after hours weekend pager number on Amion.

## 2020-08-24 NOTE — Progress Notes (Signed)
PHARMACY - TOTAL PARENTERAL NUTRITION CONSULT NOTE   Indication: expected Prolonged ileus  Patient Measurements: Height: 5\' 1"  (154.9 cm) Weight: 80.7 kg (178 lb) IBW/kg (Calculated) : 47.8 TPN AdjBW (KG): 56 Body mass index is 33.63 kg/m.  Assessment: 40 yof presenting 1/21 with cholecystitis. 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 S/p laparotomy for washout, additional drain placement, G-tube, G-tube, pyloric exclusion with gastrojejunostomy on 1/23. Pharmacy consulted to start TPN for expected prolonged ileus. Previously on CLD this admit, now NPO.  Glucose / Insulin: No hx DM. AM BG 132 (s/p decadron x 1 on 1/23) Electrolytes: Na down to 131, K high-normal 4.8, Mag high 2.8, others WNL Renal: SCr back down to 0.91, BUN up to 24 LFTs / TGs: LFTs / Tbili wnl. No TG yet Prealbumin / albumin: Prealbumin pending; albumin 2.0 Intake / Output; MIVF: Drain output 125ml/24hrs; UOP 0.7 ml/kg/hr; MIVF: NS at 125; net +1.6L this admit GI Imaging: none since TPN start Surgeries / Procedures: none since TPN start  Central access: PICC placement pending 08/24/20 TPN start date: 08/24/20  Nutritional Goals (per RD recommendation on pending): Estimated - kCal: 1400-1550, Protein: 72-90g, Fluid: 1.5L Goal TPN rate is 65 mL/hr (provides 89 g of protein and 1523 kcals per day)  Current Nutrition:  NPO  Plan:  Start TPN at 62mL/hr at 1800. Clinisol used due to Producer, television/film/video. No lipids for 7 days due to national shortage protocol. Titrate TPN to goal as appropriate. Electrolytes in TPN: 44mEq/L of Na, 40mEq/L of K (1/2 standard K), 81mEq/L of Ca, 81mEq/L of Mg (decreased from standard), and 100mmol/L of Phos. Cl:Ac ratio 1:1 Add standard MVI and trace elements to TPN Initiate Moderate q6h SSI and adjust as needed  Reduce MIVF to 90 mL/hr at 1800 when TPN bag hung Monitor TPN labs on Mon/Thurs, f/u Surgery plans   Arturo Morton, PharmD, BCPS Please  check AMION for all [place] contact numbers Clinical Pharmacist 08/24/2020 12:05 PM

## 2020-08-24 NOTE — Progress Notes (Signed)
Physical Therapy Treatment Patient Details Name: Faith Morgan MRN: [id number] DOB: [birth year] Today's Date: 08/24/2020    History of Present Illness Pt is a 70 y/o female s/p lap chole due cholectystitis and necrotic gallbladder. 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 No pertinent PMH.    PT Comments    Patient now s/p second abdominal surgery and required slightly more assist to mobilize OOB (min assist vs min guard assist). Ambulated 12 ft pushing IV pole. Goals and d/c plan still appropriate.    Follow Up Recommendations  No PT follow up     Equipment Recommendations  None recommended by PT    Recommendations for Other Services       Precautions / Restrictions Precautions Precautions: Other (comment) Precaution Comments: JP drain x 2; jejunostomy    Mobility  Bed Mobility Overal bed mobility: Needs Assistance Bed Mobility: Rolling;Sidelying to Sit;Sit to Sidelying Rolling: Min assist Sidelying to sit: Mod assist       General bed mobility comments: increaset time and effort.  heavy use of bedrails, initial support from bed pad to help start her roll, assist to raise torso;  Transfers Overall transfer level: Needs assistance Equipment used: 1 person hand held assist Transfers: Sit to/from Stand Sit to Stand: Min assist         General transfer comment: mildly unsteady. reaching for PT and IV pole  Ambulation/Gait Ambulation/Gait assistance: Min guard Gait Distance (Feet): 12 Feet Assistive device: IV Pole Gait Pattern/deviations: Step-through pattern;Decreased stride length Gait velocity: Decreased   General Gait Details: Very guarded gait secondary to pain, however, no LOB noted.   Stairs             Wheelchair Mobility    Modified Rankin (Stroke Patients Only)       Balance Overall balance assessment: Needs  assistance Sitting-balance support: No upper extremity supported Sitting balance-Leahy Scale: Good     Standing balance support: Bilateral upper extremity supported;During functional activity Standing balance-Leahy Scale: Poor Standing balance comment: reliant on external suppport, reaches out for objects throughout ambulation                            Cognition Arousal/Alertness: Awake/alert Behavior During Therapy: WFL for tasks assessed/performed Overall Cognitive Status: Within Functional Limits for tasks assessed                                        Exercises      General Comments General comments (skin integrity, edema, etc.): pt returned to OR 1/23 with slight incr assist needed, however not requiring goals or d/c plan be updated, therefore no re-evaluation completed      Pertinent Vitals/Pain Pain Assessment: Faces Faces Pain Scale: Hurts little more Pain Location: abdomen Pain Descriptors / Indicators: Aching;Operative site guarding Pain Intervention(s): Limited activity within patient's tolerance;Monitored during session;Premedicated before session;PCA encouraged    Home Living                      Prior Function            PT Goals (current goals can now be found in the care plan section) Acute Rehab PT Goals Patient Stated Goal: to decrease pain PT Goal Formulation: With patient Time For Goal Achievement:  09/04/20 Potential to Achieve Goals: Fair Progress towards PT goals: Progressing toward goals    Frequency    Min 3X/week      PT Plan Current plan remains appropriate    Co-evaluation              AM-PAC PT "6 Clicks" Mobility   Outcome Measure  Help needed turning from your back to your side while in a flat bed without using bedrails?: A Little Help needed moving from lying on your back to sitting on the side of a flat bed without using bedrails?: A Lot Help needed moving to and from a bed to a  chair (including a wheelchair)?: A Little Help needed standing up from a chair using your arms (e.g., wheelchair or bedside chair)?: A Little Help needed to walk in hospital room?: A Little Help needed climbing 3-5 steps with a railing? : A Little 6 Click Score: 17    End of Session   Activity Tolerance: Patient limited by pain Patient left: with call bell/phone within reach;in chair;with chair alarm set Nurse Communication: Mobility status PT Visit Diagnosis: Other abnormalities of gait and mobility (R26.89);Pain Pain - part of body:  (abdomen)     Time: 0347-4259 PT Time Calculation (min) (ACUTE ONLY): 39 min  Charges:  $Gait Training: 8-22 mins $Therapeutic Activity: 23-37 mins                      Arby Barrette, PT Pager [phone]    Rexanne Mano 08/24/2020, 2:48 PM

## 2020-08-24 NOTE — Progress Notes (Signed)
Peripherally Inserted Central Catheter Placement  The IV Nurse has discussed with the patient and/or persons authorized to consent for the patient, the purpose of this procedure and the potential benefits and risks involved with this procedure.  The benefits include less needle sticks, lab draws from the catheter, and the patient may be discharged home with the catheter. Risks include, but not limited to, infection, bleeding, blood clot (thrombus formation), and puncture of an artery; nerve damage and irregular heartbeat and possibility to perform a PICC exchange if needed/ordered by physician.  Alternatives to this procedure were also discussed.  Bard Power PICC patient education guide, fact sheet on infection prevention and patient information card has been provided to patient /or left at bedside.    PICC Placement Documentation  PICC Triple Lumen 08/24/20 PICC Right Brachial 38 cm 1 cm (Active)  Indication for Insertion or Continuance of Line Administration of hyperosmolar/irritating solutions (i.e. TPN, Vancomycin, etc.) 08/24/20 1746  Exposed Catheter (cm) 1 cm 08/24/20 1746  Site Assessment Clean;Dry;Intact 08/24/20 1746  Lumen #1 Status Flushed;Saline locked;Blood return noted 08/24/20 1746  Lumen #2 Status Saline locked;Flushed;Blood return noted 08/24/20 1746  Lumen #3 Status Flushed;Saline locked;Blood return noted 08/24/20 1746  Dressing Type Transparent 08/24/20 1746  Dressing Status Clean;Dry;Intact 08/24/20 1746  Antimicrobial disc in place? Yes 08/24/20 1746  Safety Lock Not Applicable 68/12/75 1700  Line Care Connections checked and tightened 08/24/20 1746  Line Adjustment (NICU/IV Team Only) No 08/24/20 1746  Dressing Intervention New dressing 08/24/20 1746  Dressing Change Due 08/31/20 08/24/20 1746       Rolena Infante 08/24/2020, 5:47 PM

## 2020-08-24 NOTE — Progress Notes (Signed)
General Surgery Follow Up Note  Subjective:    Overnight Issues:   Objective:  Vital signs for last 24 hours: Temp:  [97.2 F (36.2 C)-98.2 F (36.8 C)] 97.7 F (36.5 C) (01/24 0733) Pulse Rate:  [74-100] 83 (01/24 0733) Resp:  [9-22] 18 (01/24 0733) BP: (115-149)/(51-99) 123/69 (01/24 0733) SpO2:  [91 %-97 %] 94 % (01/24 0733) FiO2 (%):  [20 %] 20 % (01/23 1846)  Hemodynamic parameters for last 24 hours:    Intake/Output from previous day: 01/23 0701 - 01/24 0700 In: 3867.1 [I.V.:3567.1; IV Piggyback:300] Out: 2680 [Urine:1390; Drains:990; Blood:100]  Intake/Output this shift: No intake/output data recorded.  Vent settings for last 24 hours: FiO2 (%):  [20 %] 20 %  Physical Exam:  Gen: comfortable, no distress Neuro: non-focal exam HEENT: PERRL Neck: supple CV: RRR Pulm: unlabored breathing Abd: soft, midline incision with good granulation tissue, JP x1, SS & bilious, as expected, g-tube to gravity, j-tube clamped GU: clear yellow urine Extr: wwp, no edema   Results for orders placed or performed during the hospital encounter of 08/21/20 (from the past 24 hour(s))  Comprehensive metabolic panel     Status: Abnormal   Collection Time: 08/24/20  2:38 AM  Result Value Ref Range   Sodium 131 (L) 135 - 145 mmol/L   Potassium 4.8 3.5 - 5.1 mmol/L   Chloride 100 98 - 111 mmol/L   CO2 24 22 - 32 mmol/L   Glucose, Bld 132 (H) 70 - 99 mg/dL   BUN 24 (H) 8 - 23 mg/dL   Creatinine, Ser 0.91 0.44 - 1.00 mg/dL   Calcium 7.5 (L) 8.9 - 10.3 mg/dL   Total Protein 4.9 (L) 6.5 - 8.1 g/dL   Albumin 2.0 (L) 3.5 - 5.0 g/dL   AST 22 15 - 41 U/L   ALT 25 0 - 44 U/L   Alkaline Phosphatase 71 38 - 126 U/L   Total Bilirubin 0.7 0.3 - 1.2 mg/dL   GFR, Estimated >60 >60 mL/min   Anion gap 7 5 - 15  CBC     Status: Abnormal   Collection Time: 08/24/20  2:38 AM  Result Value Ref Range   WBC 12.1 (H) 4.0 - 10.5 K/uL   RBC 4.00 3.87 - 5.11 MIL/uL   Hemoglobin 12.0 12.0 - 15.0  g/dL   HCT 37.0 36.0 - 46.0 %   MCV 92.5 80.0 - 100.0 fL   MCH 30.0 26.0 - 34.0 pg   MCHC 32.4 30.0 - 36.0 g/dL   RDW 12.9 11.5 - 15.5 %   Platelets 353 150 - 400 K/uL   nRBC 0.0 0.0 - 0.2 %  Magnesium     Status: Abnormal   Collection Time: 08/24/20  2:38 AM  Result Value Ref Range   Magnesium 2.8 (H) 1.7 - 2.4 mg/dL  Phosphorus     Status: None   Collection Time: 08/24/20  2:38 AM  Result Value Ref Range   Phosphorus 3.7 2.5 - 4.6 mg/dL    Assessment & Plan: The plan of care was discussed with the bedside nurse for the day, Elmyra Ricks, who is in agreement with this plan and no additional concerns were raised.   Present on Admission: **None**    LOS: 1 day   Additional comments:I reviewed the patient's new clinical lab test results.   and I reviewed the patients new imaging test results.    S/p lap subtotal cholecystectomy for necrotic cholecystitis - POD3 S/p exlap, GJ, g-tube, j-tube,  additional drain placement - POD1, performed for leak from duodenum identified on CT A/P  Pain control - PCA  FEN - NPO, okay to have gum/mints, start TPN today, cont g-tube to gravity DVT - SCDs, LMWH Dispo - 4NP   Jesusita Oka, MD Trauma & General Surgery Please use [email or web address] to contact on call provider  08/24/2020  *Care during the described time interval was provided by me. I have reviewed this patient's available data, including medical history, events of note, physical examination and test results as part of my evaluation.

## 2020-08-24 NOTE — Progress Notes (Signed)
Occupational Therapy Treatment Patient Details Name: Faith Morgan MRN: [id number] DOB: 1950-12-30 Today's Date: 08/24/2020    History of present illness Pt is a 70 y/o female s/p lap chole due cholectystitis and necrotic gallbladder. 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 No pertinent PMH.   OT comments  Pt making steady progress towards OT goals this session. Session focus on functional transfer training as precursor to higher level BADLs. Pt continues to present with increased pain, impaired balance, decreased activity tolerance general malaise impacting pts ability to complete BADLs independently. Pt reports needing to void bladder upon arrival with pt needing MIN to transfer from recliner>BSC. Pt mildly unsteady reaching out for OTA and IV during transfer. Pt reports feeling lightheaded once on BSC BP 124/80, HR increase to as much as 148 bpm pt on 2L with drop to 86% with activity however rebound with ~ 2 min seated rest break. Currently agree with DC plan pending continued progress, will continue to follow acutely per POC.    Follow Up Recommendations  No OT follow up;Supervision/Assistance - 24 hour    Equipment Recommendations  Tub/shower seat    Recommendations for Other Services      Precautions / Restrictions Precautions Precautions: Other (comment) Precaution Comments: JP drain x 2; jejunostomy Restrictions Weight Bearing Restrictions: No       Mobility Bed Mobility Overal bed mobility: Needs Assistance Bed Mobility: Rolling;Sit to Sidelying Rolling: Min assist Sidelying to sit: Mod assist     Sit to sidelying: Mod assist General bed mobility comments: MOD to return to supine with pt coming down onto L elbow to avoid pain on R side of ABD,most assist needed to elevate BLEs back to bed  Transfers Overall transfer level: Needs  assistance Equipment used: 1 person hand held assist Transfers: Sit to/from Stand Sit to Stand: Min assist         General transfer comment: mildly unsteady. MIN A needed as pt reaching out for OTA and IV pole during sit<>stand from [facility] and recliner    Balance Overall balance assessment: Needs assistance Sitting-balance support: No upper extremity supported Sitting balance-Leahy Scale: Good     Standing balance support: Bilateral upper extremity supported;During functional activity Standing balance-Leahy Scale: Poor Standing balance comment: reliant on external suppport, reaches out for objects throughout ambulation                           ADL either performed or assessed with clinical judgement   ADL Overall ADL's : Needs assistance/impaired                         Toilet Transfer: Minimal assistance;BSC;Stand-pivot Toilet Transfer Details (indicate cue type and reason): MIN A to pivot to BSC from recliner>BSC, MIN A to steady pt d/t reports of feeling lightheaded         Functional mobility during ADLs: Minimal assistance General ADL Comments: pt continues to present with increased pain, reports of lightheadness, decreased activity tolerance general malaise     Vision       Perception     Praxis      Cognition Arousal/Alertness: Awake/alert Behavior During Therapy: WFL for tasks assessed/performed Overall Cognitive Status: Within Functional Limits for tasks assessed  Exercises     Shoulder Instructions       General Comments pt reports feeling lightheaded once on BSC BP 124/80, HR increase to as much as 148 bpm pt on 2L with drop to 86% with activity however rebound with ~ 2 min seated rest break    Pertinent Vitals/ Pain       Pain Assessment: Faces Faces Pain Scale: Hurts little more Pain Location: abdomen Pain Descriptors / Indicators: Aching;Operative site  guarding Pain Intervention(s): Limited activity within patient's tolerance;Monitored during session;Repositioned;PCA encouraged  Home Living                                          Prior Functioning/Environment              Frequency  Min 2X/week        Progress Toward Goals  OT Goals(current goals can now be found in the care plan section)  Progress towards OT goals: Progressing toward goals  Acute Rehab OT Goals Patient Stated Goal: to decrease pain OT Goal Formulation: With patient Time For Goal Achievement: 09/03/20 Potential to Achieve Goals: Good  Plan Discharge plan remains appropriate;Frequency remains appropriate    Co-evaluation                 AM-PAC OT "6 Clicks" Daily Activity     Outcome Measure   Help from another person eating meals?: A Little Help from another person taking care of personal grooming?: A Little Help from another person toileting, which includes using toliet, bedpan, or urinal?: A Lot Help from another person bathing (including washing, rinsing, drying)?: A Lot Help from another person to put on and taking off regular upper body clothing?: A Lot Help from another person to put on and taking off regular lower body clothing?: Total 6 Click Score: 13    End of Session Equipment Utilized During Treatment: Oxygen;Other (comment) (BSc, 2L)  OT Visit Diagnosis: Pain Pain - part of body:  (ABD)   Activity Tolerance Patient tolerated treatment well   Patient Left in bed;with call bell/phone within reach;with bed alarm set   Nurse Communication Mobility status        Time: 3716-9678 OT Time Calculation (min): 37 min  Charges: OT General Charges $OT Visit: 1 Visit OT Treatments $Self Care/Home Management : 23-37 mins  Harley Alto., COTA/L Acute Rehabilitation Services [phone]    Precious Haws 08/24/2020, 4:59 PM

## 2020-08-24 NOTE — Anesthesia Postprocedure Evaluation (Signed)
Anesthesia Post Note  Patient: Faith Morgan  Procedure(s) Performed: LAPAROSCOPIC CHOLECYSTECTOMY, SUBTOTAL (N/A )     Patient location during evaluation: PACU Anesthesia Type: General Level of consciousness: awake and alert Pain management: pain level controlled Vital Signs Assessment: post-procedure vital signs reviewed and stable Respiratory status: spontaneous breathing, nonlabored ventilation, respiratory function stable and patient connected to nasal cannula oxygen Cardiovascular status: blood pressure returned to baseline and stable Postop Assessment: no apparent nausea or vomiting Anesthetic complications: no   No complications documented.  Last Vitals:  Vitals:   08/24/20 0715 08/24/20 0733  BP:  123/69  Pulse: 84 83  Resp: 14 18  Temp:  36.5 C  SpO2: 93% 94%    Last Pain:  Vitals:   08/24/20 0733  TempSrc: Oral  PainSc:                  Watson S

## 2020-08-25 ENCOUNTER — Inpatient Hospital Stay (HOSPITAL_COMMUNITY): Payer: Medicare HMO

## 2020-08-25 LAB — COMPREHENSIVE METABOLIC PANEL
ALT: 22 U/L (ref 0–44)
AST: 24 U/L (ref 15–41)
Albumin: 1.6 g/dL — ABNORMAL LOW (ref 3.5–5.0)
Alkaline Phosphatase: 91 U/L (ref 38–126)
Anion gap: 9 (ref 5–15)
BUN: 17 mg/dL (ref 8–23)
CO2: 25 mmol/L (ref 22–32)
Calcium: 6.8 mg/dL — ABNORMAL LOW (ref 8.9–10.3)
Chloride: 99 mmol/L (ref 98–111)
Creatinine, Ser: 0.67 mg/dL (ref 0.44–1.00)
GFR, Estimated: >60 mL/min (ref >60)
Glucose, Bld: 143 mg/dL — ABNORMAL HIGH (ref 70–99)
Potassium: 4.1 mmol/L (ref 3.5–5.1)
Sodium: 133 mmol/L — ABNORMAL LOW (ref 135–145)
Total Bilirubin: 0.8 mg/dL (ref 0.3–1.2)
Total Protein: 4.9 g/dL — ABNORMAL LOW (ref 6.5–8.1)

## 2020-08-25 LAB — PREALBUMIN: Prealbumin: <5 mg/dL — ABNORMAL LOW (ref 18–38)

## 2020-08-25 LAB — CBC
HCT: 32.6 % — ABNORMAL LOW (ref 36.0–46.0)
Hemoglobin: 10 g/dL — ABNORMAL LOW (ref 12.0–15.0)
MCH: 29.7 pg (ref 26.0–34.0)
MCHC: 30.7 g/dL (ref 30.0–36.0)
MCV: 96.7 fL (ref 80.0–100.0)
Platelets: 297 10*3/uL (ref 150–400)
RBC: 3.37 MIL/uL — ABNORMAL LOW (ref 3.87–5.11)
RDW: 13.2 % (ref 11.5–15.5)
WBC: 10.2 10*3/uL (ref 4.0–10.5)
nRBC: 0 % (ref 0.0–0.2)

## 2020-08-25 LAB — DIFFERENTIAL
Abs Immature Granulocytes: 0.06 10*3/uL (ref 0.00–0.07)
Basophils Absolute: 0 10*3/uL (ref 0.0–0.1)
Basophils Relative: 0 %
Eosinophils Absolute: 0 10*3/uL (ref 0.0–0.5)
Eosinophils Relative: 0 %
Immature Granulocytes: 1 %
Lymphocytes Relative: 4 %
Lymphs Abs: 0.4 10*3/uL — ABNORMAL LOW (ref 0.7–4.0)
Monocytes Absolute: 0.5 10*3/uL (ref 0.1–1.0)
Monocytes Relative: 5 %
Neutro Abs: 9.1 10*3/uL — ABNORMAL HIGH (ref 1.7–7.7)
Neutrophils Relative %: 90 %

## 2020-08-25 LAB — MAGNESIUM: Magnesium: 2.6 mg/dL — ABNORMAL HIGH (ref 1.7–2.4)

## 2020-08-25 LAB — GLUCOSE, CAPILLARY
Glucose-Capillary: 156 mg/dL — ABNORMAL HIGH (ref 70–99)
Glucose-Capillary: 155 mg/dL — ABNORMAL HIGH (ref 70–99)
Glucose-Capillary: 163 mg/dL — ABNORMAL HIGH (ref 70–99)
Glucose-Capillary: 145 mg/dL — ABNORMAL HIGH (ref 70–99)

## 2020-08-25 LAB — PHOSPHORUS: Phosphorus: 1.8 mg/dL — ABNORMAL LOW (ref 2.5–4.6)

## 2020-08-25 MED ORDER — METHOCARBAMOL 1000 MG/10ML IJ SOLN
750.0000 mg | Freq: Four times a day (QID) | INTRAVENOUS | Status: DC
  Administered 2020-08-25 – 2020-08-27 (×6): 750 mg via INTRAVENOUS
  Filled 2020-08-25 (×9): qty 7.5

## 2020-08-25 MED ORDER — TRACE MINERALS CU-MN-SE-ZN 300-55-60-3000 MCG/ML IV SOLN
INTRAVENOUS | Status: AC
  Filled 2020-08-25: qty 400

## 2020-08-25 MED ORDER — INSULIN ASPART 100 UNIT/ML ~~LOC~~ SOLN: 0.0000 [IU] | SUBCUTANEOUS | Status: DC | PRN

## 2020-08-25 MED ORDER — VITAL 1.5 CAL PO LIQD
1000.0000 mL | ORAL | Status: DC
  Administered 2020-08-25 – 2020-08-28 (×4): 1000 mL
  Filled 2020-08-25 (×5): qty 1000

## 2020-08-25 MED ORDER — IOHEXOL 350 MG/ML SOLN
80.0000 mL | Freq: Once | INTRAVENOUS | Status: AC | PRN
  Administered 2020-08-25: 80 mL via INTRAVENOUS

## 2020-08-25 MED ORDER — INSULIN ASPART 100 UNIT/ML ~~LOC~~ SOLN
0.0000 [IU] | SUBCUTANEOUS | Status: DC
  Administered 2020-08-25: 2 [IU] via SUBCUTANEOUS
  Administered 2020-08-25 – 2020-08-26 (×3): 3 [IU] via SUBCUTANEOUS
  Administered 2020-08-26 (×2): 5 [IU] via SUBCUTANEOUS
  Administered 2020-08-26: 3 [IU] via SUBCUTANEOUS
  Administered 2020-08-26: 5 [IU] via SUBCUTANEOUS
  Administered 2020-08-26: 3 [IU] via SUBCUTANEOUS
  Administered 2020-08-27: 5 [IU] via SUBCUTANEOUS

## 2020-08-25 MED ORDER — SODIUM CHLORIDE 0.9 % IV SOLN: INTRAVENOUS | Status: DC

## 2020-08-25 MED ORDER — ACETAMINOPHEN 10 MG/ML IV SOLN
1000.0000 mg | Freq: Four times a day (QID) | INTRAVENOUS | Status: AC
  Administered 2020-08-25 – 2020-08-26 (×4): 1000 mg via INTRAVENOUS
  Filled 2020-08-25 (×4): qty 100

## 2020-08-25 MED ORDER — ORAL CARE MOUTH RINSE
15.0000 mL | Freq: Two times a day (BID) | OROMUCOSAL | Status: DC
  Administered 2020-08-25 – 2020-09-01 (×11): 15 mL via OROMUCOSAL

## 2020-08-25 MED ORDER — SODIUM PHOSPHATES 45 MMOLE/15ML IV SOLN
30.0000 mmol | Freq: Once | INTRAVENOUS | Status: AC
  Administered 2020-08-25: 30 mmol via INTRAVENOUS
  Filled 2020-08-25: qty 10

## 2020-08-25 MED ORDER — CHLORHEXIDINE GLUCONATE 0.12 % MT SOLN
15.0000 mL | Freq: Two times a day (BID) | OROMUCOSAL | Status: DC
  Administered 2020-08-25 – 2020-09-01 (×14): 15 mL via OROMUCOSAL
  Filled 2020-08-25 (×14): qty 15

## 2020-08-25 MED ORDER — VITAL HIGH PROTEIN PO LIQD
1000.0000 mL | ORAL | Status: DC
  Filled 2020-08-25: qty 1000

## 2020-08-25 MED ORDER — GADOBUTROL 1 MMOL/ML IV SOLN
8.0000 mL | Freq: Once | INTRAVENOUS | Status: AC | PRN
  Administered 2020-08-25: 8 mL via INTRAVENOUS

## 2020-08-25 MED ORDER — KETOROLAC TROMETHAMINE 15 MG/ML IJ SOLN
15.0000 mg | Freq: Four times a day (QID) | INTRAMUSCULAR | Status: DC
  Administered 2020-08-25 – 2020-08-27 (×8): 15 mg via INTRAVENOUS
  Filled 2020-08-25 (×8): qty 1

## 2020-08-25 NOTE — Progress Notes (Addendum)
Trauma/Critical Care Follow Up Note  Subjective:    Overnight Issues:   Objective:  Vital signs for last 24 hours: Temp:  [97.9 F (36.6 C)-98.4 F (36.9 C)] 98.3 F (36.8 C) (01/25 0731) Pulse Rate:  [85-99] 99 (01/25 0731) Resp:  [11-20] 18 (01/25 0800) BP: (127-141)/(50-64) 138/61 (01/25 0731) SpO2:  [92 %-95 %] 93 % (01/25 0800) FiO2 (%):  [21 %] 21 % (01/24 1618)  Hemodynamic parameters for last 24 hours:    Intake/Output from previous day: 01/24 0701 - 01/25 0700 In: 204.2 [I.V.:204.2] Out: 890 [Urine:350; Drains:540]  Intake/Output this shift: Total I/O In: -  Out: 750 [Urine:400; Drains:350]  Vent settings for last 24 hours: FiO2 (%):  [21 %] 21 %  Physical Exam:  Gen: comfortable, no distress Neuro: non-focal exam HEENT: PERRL Neck: supple CV: RRR Pulm: unlabored breathing Abd: soft, appropriately TTP, midline wound with good granulation tissue, g-tube to gravity, j-tube clamped, JP x2, one SS, one bilious GU: spont voids Extr: wwp, no edema   Results for orders placed or performed during the hospital encounter of 08/21/20 (from the past 24 hour(s))  Glucose, capillary     Status: Abnormal   Collection Time: 08/24/20  5:22 PM  Result Value Ref Range   Glucose-Capillary 111 (H) 70 - 99 mg/dL   Comment 1 Notify RN    Comment 2 Document in Chart   Glucose, capillary     Status: Abnormal   Collection Time: 08/24/20 11:37 PM  Result Value Ref Range   Glucose-Capillary 120 (H) 70 - 99 mg/dL  Prealbumin     Status: Abnormal   Collection Time: 08/25/20  5:20 AM  Result Value Ref Range   Prealbumin <5 (L) 18 - 38 mg/dL  CBC     Status: Abnormal   Collection Time: 08/25/20  5:20 AM  Result Value Ref Range   WBC 10.2 4.0 - 10.5 K/uL   RBC 3.37 (L) 3.87 - 5.11 MIL/uL   Hemoglobin 10.0 (L) 12.0 - 15.0 g/dL   HCT 32.6 (L) 36.0 - 46.0 %   MCV 96.7 80.0 - 100.0 fL   MCH 29.7 26.0 - 34.0 pg   MCHC 30.7 30.0 - 36.0 g/dL   RDW 13.2 11.5 - 15.5 %    Platelets 297 150 - 400 K/uL   nRBC 0.0 0.0 - 0.2 %  Differential     Status: Abnormal   Collection Time: 08/25/20  5:20 AM  Result Value Ref Range   Neutrophils Relative % 90 %   Neutro Abs 9.1 (H) 1.7 - 7.7 K/uL   Lymphocytes Relative 4 %   Lymphs Abs 0.4 (L) 0.7 - 4.0 K/uL   Monocytes Relative 5 %   Monocytes Absolute 0.5 0.1 - 1.0 K/uL   Eosinophils Relative 0 %   Eosinophils Absolute 0.0 0.0 - 0.5 K/uL   Basophils Relative 0 %   Basophils Absolute 0.0 0.0 - 0.1 K/uL   WBC Morphology VACUOLATED NEUTROPHILS    Immature Granulocytes 1 %   Abs Immature Granulocytes 0.06 0.00 - 0.07 K/uL  Glucose, capillary     Status: Abnormal   Collection Time: 08/25/20  5:24 AM  Result Value Ref Range   Glucose-Capillary 156 (H) 70 - 99 mg/dL  Comprehensive metabolic panel     Status: Abnormal   Collection Time: 08/25/20  7:00 AM  Result Value Ref Range   Sodium 133 (L) 135 - 145 mmol/L   Potassium 4.1 3.5 - 5.1 mmol/L  Chloride 99 98 - 111 mmol/L   CO2 25 22 - 32 mmol/L   Glucose, Bld 143 (H) 70 - 99 mg/dL   BUN 17 8 - 23 mg/dL   Creatinine, Ser 0.67 0.44 - 1.00 mg/dL   Calcium 6.8 (L) 8.9 - 10.3 mg/dL   Total Protein 4.9 (L) 6.5 - 8.1 g/dL   Albumin 1.6 (L) 3.5 - 5.0 g/dL   AST 24 15 - 41 U/L   ALT 22 0 - 44 U/L   Alkaline Phosphatase 91 38 - 126 U/L   Total Bilirubin 0.8 0.3 - 1.2 mg/dL   GFR, Estimated >60 >60 mL/min   Anion gap 9 5 - 15  Magnesium     Status: Abnormal   Collection Time: 08/25/20  7:00 AM  Result Value Ref Range   Magnesium 2.6 (H) 1.7 - 2.4 mg/dL  Phosphorus     Status: Abnormal   Collection Time: 08/25/20  7:00 AM  Result Value Ref Range   Phosphorus 1.8 (L) 2.5 - 4.6 mg/dL    Assessment & Plan: The plan of care was discussed with the bedside nurse for the day, Chrys Racer, who is in agreement with this plan and no additional concerns were raised.   Present on Admission: **None**    LOS: 2 days   Additional comments:I reviewed the patient's new  clinical lab test results.   and I reviewed the patients new imaging test results.    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 Plan for CT chest, MR liver to eval for metastatic disease. Palliative consult placed yesterday. Plan for Onc c/s, but patient requesting delay of this for one more day, which I think is reasonable.  S/p exlap, GJ, g-tube, j-tube, additional drain placement - POD2, performed for leak from duodenum identified on CT A/P Pain control - PCA  ID - cont [email or web address], end 1/26 FEN - NPO, okay to have gum/mints, cont TPN today, cont g-tube to gravity, start trickle j-tube feeds with no advancement DVT - SCDs,LMWH Dispo -4NP   Jesusita Oka, MD Trauma & General Surgery Please use [email or web address] to contact on call provider  08/25/2020  *Care during the described time interval was provided by me. I have reviewed this patient's available data, including medical history, events of note, physical examination and test results as part of my evaluation.

## 2020-08-25 NOTE — Progress Notes (Signed)
This RN wasted PCA  1mL of Dilaudid in stericycle. Witnessed with Denman George, RN    New syringe setup. See [facility]

## 2020-08-25 NOTE — Progress Notes (Addendum)
Nutrition Follow-up  DOCUMENTATION CODES:   Obesity unspecified  INTERVENTION:  TPN per Pharmacy.   Trickle tube feeds using Vital 1.5 cal formula via Jtube at rate of 10 ml/hr. Per MD, no plans to titrate/advance feeds at this time.  NUTRITION DIAGNOSIS:   Inadequate oral intake related to inability to eat as evidenced by NPO status; ongoing  GOAL:   Patient will meet greater than or equal to 90% of their needs; to be met with TPN  MONITOR:   TF tolerance,Skin,Weight trends,Labs,I & O's,Other (Comment) (TPN tolerance)  REASON FOR ASSESSMENT:   Consult New TPN/TNA  ASSESSMENT:   26 yof presenting 1/21 with cholecystitis. 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 S/p laparotomy for washout, additional drain placement, G-tube, G-tube, pyloric exclusion with gastrojejunostomy on 1/23. Path resulted 1/24 with mod to poorly differentiated adenocarcinoma, presumably gallbladder cancer per MD.  1/21- laparoscopicsubtotalcholecystectomy with drain placement 1/23- s/p ex lap,abdominal washout, pyloric exclusion, retrocolic retrogastric gastrojejunostomy, gastrostomy tube placement, jejunostomy tube placement, omental pedicle flap coverage of large duodenal fistula, placement of additional intra-abdominal drains   Trickle tube feeds to be initiated today via J-tube. Per MD, no plans for advancement. Trickle tube feeds to provide 360 kcal and 16 grams of protein. TPN to be increased to 50 ml/hr today with eventual goal rate pending tolerance to 75 ml/hr which provides 1829 kcal and 90 grams of protein. May hold lipids for 7-10 days due to national shortage protocol. Pt reports last meal consumed was on 1/21 which was chicken noodle soup. Pt unable to recall po intake prior to that.   Once able to advance past trickle feeds via Jtube as tolerated per MD, recommend advancement using Vital 1.5 cal to goal rate of 55 ml/hr with 45 ml Prosource TF once  daily to provide 2020 kcal and 100 grams of protein.   Labs and medications reviewed. Phosphorous low at 1.8. Magnesium low at 2.6.  Diet Order:   Diet Order            Diet NPO time specified  Diet effective now                 EDUCATION NEEDS:   Not appropriate for education at this time  Skin:  Skin Assessment: Skin Integrity Issues: Skin Integrity Issues:: Incisions,Wound VAC Wound Vac: abdomen Incisions: abdomen  Last BM:  1/19  Height:   Ht Readings from Last 1 Encounters:  08/21/20 '5\' 1"'  (1.549 m)    Weight:   Wt Readings from Last 1 Encounters:  08/21/20 80.7 kg    BMI:  Body mass index is 33.63 kg/m.  Estimated Nutritional Needs:   Kcal:  1800-2100  Protein:  90-110 grams  Fluid:  >/= 1.8 L/day  Corrin Parker, MS, RD, LDN RD pager number/after hours weekend pager number on Amion.

## 2020-08-25 NOTE — Consult Note (Addendum)
[facility] Nurse Consult Note: Reason for Consult: Surgical team following for assessment and plan of care. Requested to apply Vac to abd wound. Wound type: Full thickness post-op midline abd wound Measurement: 17X7X5cm Wound bed: beefy red, some yellow adipose interspersed throughout Drainage (amount, consistency, odor) small amt bloody drainage Periwound: intact skin surrounding Dressing procedure/placement/frequency: Pt used PCA for pain prior to the procedure and tolerated with minimal amt discomfort.  Applied one piece black foam to 132mm cont suction. WOC will plan to change dressing again on Fri to adjust to a M/W/F schedule. Julien Girt MSN, RN, [place], [place], [place]

## 2020-08-25 NOTE — Progress Notes (Signed)
PHARMACY - TOTAL PARENTERAL NUTRITION CONSULT NOTE   Indication: expected Prolonged ileus  Patient Measurements: Height: 5\' 1"  (154.9 cm) Weight: 80.7 kg (178 lb) IBW/kg (Calculated) : 47.8 TPN AdjBW (KG): 56 Body mass index is 33.63 kg/m.  Assessment: 50 yof presenting 1/21 with cholecystitis. 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 S/p laparotomy for washout, additional drain placement, G-tube, G-tube, pyloric exclusion with gastrojejunostomy on 1/23. Pharmacy consulted to start TPN for expected prolonged ileus. Previously on CLD this admit, now NPO.  Glucose / Insulin: No hx DM, CBGs controlled prior to TPN start. CBGs 120-156. 3 units SSI utilized in last 24hrs Electrolytes: Na 131>133, K down to 4.1, Mag 2.8>2.6, Phos 3.7>1.8; others WNL Renal: SCr back down to 0.91, BUN up to 24 LFTs / TGs: LFTs / Tbili wnl. No TG yet Prealbumin / albumin: Prealbumin <5; albumin 1.6 Intake / Output; MIVF: Drain output 729ml/24hrs; UOP 0.2 ml/kg/hr per documentation; MIVF: NS at 79ml/hr; net +0.6L this admit GI Imaging: none since TPN start Surgeries / Procedures: none since TPN start  Central access: PICC placement pending 08/24/20 TPN start date: 08/24/20  Nutritional Goals (per RD recommendation on 08/24/20): Estimated - kCal: 1800-2000, Protein: 90-100g, Fluid: >/=1.8L  Goal TPN rate is 75 mL/hr (provides 90g of protein and 1829kcals per day)  Current Nutrition:  NPO  Plan:  Increase TPN to 64mL/hr at 1800. Watch electrolytes and titrate TPN to goal as appropriate. *Per discussion with dietitian, patient ok for no lipids for 7-10 days due to national shortage protocol (hold until at least 1/31). Clinisol utilized due to Producer, television/film/video. Electrolytes in TPN: increase to 29mEq/L of K, decrease to 87mEq/L of Mg, increase to 77mmol/L of Phos; otherwise, continue same - 4mEq/L of Na, 49mEq/L of Ca. Cl:Ac ratio 1:1 (all lytes will increase with rate  increase) Give NaPhos 49mmol IV x 1 Add standard MVI and trace elements to TPN Continue Moderate q6h SSI and adjust as needed  Reduce MIVF to 75 mL/hr at 1800 when new TPN bag hung Monitor TPN labs on Mon/Thurs, f/u Surgery plans   Arturo Morton, PharmD, BCPS Please check AMION for all [facility] contact numbers Clinical Pharmacist 08/25/2020 7:45 AM

## 2020-08-26 ENCOUNTER — Inpatient Hospital Stay (HOSPITAL_COMMUNITY): Payer: Medicare HMO

## 2020-08-26 LAB — COMPREHENSIVE METABOLIC PANEL
ALT: 24 U/L (ref 0–44)
AST: 32 U/L (ref 15–41)
Albumin: 1.4 g/dL — ABNORMAL LOW (ref 3.5–5.0)
Alkaline Phosphatase: 89 U/L (ref 38–126)
Anion gap: 7 (ref 5–15)
BUN: 14 mg/dL (ref 8–23)
CO2: 25 mmol/L (ref 22–32)
Calcium: 6.6 mg/dL — ABNORMAL LOW (ref 8.9–10.3)
Chloride: 103 mmol/L (ref 98–111)
Creatinine, Ser: 0.53 mg/dL (ref 0.44–1.00)
GFR, Estimated: >60 mL/min (ref >60)
Glucose, Bld: 114 mg/dL — ABNORMAL HIGH (ref 70–99)
Potassium: 3.5 mmol/L (ref 3.5–5.1)
Sodium: 135 mmol/L (ref 135–145)
Total Bilirubin: 1.2 mg/dL (ref 0.3–1.2)
Total Protein: 4.2 g/dL — ABNORMAL LOW (ref 6.5–8.1)

## 2020-08-26 LAB — URINALYSIS, ROUTINE W REFLEX MICROSCOPIC
Bacteria, UA: NONE SEEN
Bilirubin Urine: NEGATIVE
Glucose, UA: 50 mg/dL — AB
Ketones, ur: NEGATIVE mg/dL
Leukocytes,Ua: NEGATIVE
Nitrite: NEGATIVE
Protein, ur: 30 mg/dL — AB
Specific Gravity, Urine: 1.045 — ABNORMAL HIGH (ref 1.005–1.030)
pH: 5 (ref 5.0–8.0)

## 2020-08-26 LAB — BLOOD GAS, ARTERIAL
Acid-base deficit: 0.6 mmol/L (ref 0.0–2.0)
Bicarbonate: 23.2 mmol/L (ref 20.0–28.0)
FIO2: 21
O2 Saturation: 92.5 %
Patient temperature: 37
pCO2 arterial: 35.5 mmHg (ref 32.0–48.0)
pH, Arterial: 7.43 (ref 7.350–7.450)
pO2, Arterial: 64.8 mmHg — ABNORMAL LOW (ref 83.0–108.0)

## 2020-08-26 LAB — CBC
HCT: 32.7 % — ABNORMAL LOW (ref 36.0–46.0)
Hemoglobin: 11 g/dL — ABNORMAL LOW (ref 12.0–15.0)
MCH: 31 pg (ref 26.0–34.0)
MCHC: 33.6 g/dL (ref 30.0–36.0)
MCV: 92.1 fL (ref 80.0–100.0)
Platelets: 241 10*3/uL (ref 150–400)
RBC: 3.55 MIL/uL — ABNORMAL LOW (ref 3.87–5.11)
RDW: 13.2 % (ref 11.5–15.5)
WBC: 8.9 10*3/uL (ref 4.0–10.5)
nRBC: 0 % (ref 0.0–0.2)

## 2020-08-26 LAB — MAGNESIUM: Magnesium: 2.3 mg/dL (ref 1.7–2.4)

## 2020-08-26 LAB — GLUCOSE, CAPILLARY
Glucose-Capillary: 155 mg/dL — ABNORMAL HIGH (ref 70–99)
Glucose-Capillary: 156 mg/dL — ABNORMAL HIGH (ref 70–99)
Glucose-Capillary: 160 mg/dL — ABNORMAL HIGH (ref 70–99)
Glucose-Capillary: 170 mg/dL — ABNORMAL HIGH (ref 70–99)
Glucose-Capillary: 212 mg/dL — ABNORMAL HIGH (ref 70–99)
Glucose-Capillary: 234 mg/dL — ABNORMAL HIGH (ref 70–99)
Glucose-Capillary: 236 mg/dL — ABNORMAL HIGH (ref 70–99)

## 2020-08-26 LAB — PHOSPHORUS: Phosphorus: 2.4 mg/dL — ABNORMAL LOW (ref 2.5–4.6)

## 2020-08-26 MED ORDER — POTASSIUM PHOSPHATES 15 MMOLE/5ML IV SOLN
15.0000 mmol | Freq: Once | INTRAVENOUS | Status: AC
  Administered 2020-08-26: 15 mmol via INTRAVENOUS
  Filled 2020-08-26: qty 5

## 2020-08-26 MED ORDER — TRACE MINERALS CU-MN-SE-ZN 300-55-60-3000 MCG/ML IV SOLN
INTRAVENOUS | Status: AC
  Filled 2020-08-26: qty 600

## 2020-08-26 MED ORDER — METOPROLOL TARTRATE 5 MG/5ML IV SOLN
5.0000 mg | Freq: Four times a day (QID) | INTRAVENOUS | Status: DC | PRN
  Administered 2020-08-26 (×2): 5 mg via INTRAVENOUS
  Filled 2020-08-26 (×2): qty 5

## 2020-08-26 MED ORDER — DIPHENHYDRAMINE HCL 12.5 MG/5ML PO ELIX: 12.5000 mg | ORAL_SOLUTION | Freq: Four times a day (QID) | ORAL | Status: DC | PRN

## 2020-08-26 MED ORDER — SODIUM CHLORIDE 0.9 % IV SOLN: INTRAVENOUS | Status: DC

## 2020-08-26 MED ORDER — DIPHENHYDRAMINE HCL 50 MG/ML IJ SOLN: 12.5000 mg | Freq: Four times a day (QID) | INTRAMUSCULAR | Status: DC | PRN

## 2020-08-26 MED ORDER — SODIUM CHLORIDE 0.9 % IV SOLN: INTRAVENOUS | Status: AC

## 2020-08-26 MED ORDER — BUSPIRONE HCL 5 MG PO TABS
7.5000 mg | ORAL_TABLET | Freq: Three times a day (TID) | ORAL | Status: DC | PRN
  Administered 2020-08-26: 7.5 mg via ORAL
  Filled 2020-08-26: qty 2

## 2020-08-26 MED ORDER — BUSPIRONE HCL 5 MG PO TABS: 7.5000 mg | ORAL_TABLET | Freq: Three times a day (TID) | ORAL | Status: DC | PRN

## 2020-08-26 NOTE — Progress Notes (Signed)
PHARMACY - TOTAL PARENTERAL NUTRITION CONSULT NOTE   Indication: expected Prolonged ileus  Patient Measurements: Height: 5\' 1"  (154.9 cm) Weight: 80.7 kg (178 lb) IBW/kg (Calculated) : 47.8 TPN AdjBW (KG): 56 Body mass index is 33.63 kg/m.  Assessment: Faith Morgan 1/21 with cholecystitis. 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 S/p laparotomy for washout, additional drain placement, G-tube, G-tube, pyloric exclusion with gastrojejunostomy on 1/23. Path resulted 1/24 with moderate to poorly differentiated adenocarcinoma. Pharmacy consulted to start TPN for expected prolonged ileus. Previously on CLD this admit, now NPO.  Glucose / Insulin: No hx DM, CBGs controlled prior to TPN start. CBGs 114-163. 14 units SSI utilized in last 24hrs Electrolytes: Na 133>135, K down to 3.5, Mag normalized, Phos 1.8>2.4 (s/p NaPhos 28mmol x 1); others WNL Renal: SCr back down to 0.53, BUN WNL LFTs / TGs: LFTs / Tbili wnl. No TG yet Prealbumin / albumin: Prealbumin <5; albumin 1.4 Intake / Output; MIVF: Drain output 467ml/24hrs; UOP 0.2 ml/kg/hr per documentation; MIVF: NS at 52ml/hr; net +1.1L this admit GI Imaging: 1/25 CT chest - no definitive evidence of metastatic disease to chest; bilateral pleural effusions 1/25 MRI liver - highly suggestive of tumor spread into adjacent liver along gallbladder fossa, suspicious for metastatic disease, R abdominal wall abscess, ?fistula from duodenum Surgeries / Procedures: none since TPN start  Central access: PICC placement pending 08/24/20 TPN start date: 08/24/20  Nutritional Goals (per RD recommendation on 08/24/20): Estimated - kCal: 1800-2000, Protein: 90-100g, Fluid: >/=1.8L  Goal TPN rate is 75 mL/hr (provides 90g of protein and 1829kcals per day)  Current Nutrition:  NPO Trickle J-tube feeds with no advancement started 1/25 - Vital 1.5 at 10 ml/hr (continuing same 1/26 per Dr. Bobbye Morton)  Plan:  Increase TPN  to goal rate 52mL/hr at 1800. Watch electrolytes and titrate TPN to goal as appropriate. *Per discussion with dietitian, patient ok for no lipids for 7-10 days due to national shortage protocol (hold until at least 1/31). Clinisol utilized due to Producer, television/film/video. Electrolytes in TPN: increase back to standard 29mEq/L of K; otherwise, continue same today - 4mEq/L of Na, 59mEq/L of Ca, 76mEq/L of Mg, and 56mmol/L of Phos. Cl:Ac ratio 1:1 (all lytes will increase with rate increase) Give KPhos 50mmol IV x 1 Add standard MVI and trace elements to TPN Continue moderate q4h SSI and adjust as needed  Reduce MIVF to 50 mL/hr at 1800 when new TPN bag hung Monitor TPN labs F/u Surgery plans for tube feed advancement/toleration and ability to wean TPN   Arturo Morton, PharmD, BCPS Please check AMION for all [facility] contact numbers Clinical Pharmacist 08/26/2020 7:53 AM

## 2020-08-26 NOTE — Progress Notes (Signed)
Physical Therapy Treatment Patient Details Name: Faith Morgan MRN: [id number] DOB: 1951-07-16 Today's Date: 08/26/2020    History of Present Illness Pt is a 70 y/o female s/p lap chole due cholectystitis and necrotic gallbladder. 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 No pertinent PMH.    PT Comments    Pt starting to make good progress toward goals, still somewhat limited by pain and high HR's in the 150's with activity.  Emphasis on transition to sitting, scooting to EOB, sit to stands/transfer safety and progressing gait.   Follow Up Recommendations  No PT follow up     Equipment Recommendations  None recommended by PT    Recommendations for Other Services       Precautions / Restrictions Precautions Precautions: Other (comment) Precaution Comments: JP drain x 2; jejunostomy, 2 IV poles, wound vac Restrictions Weight Bearing Restrictions: No    Mobility  Bed Mobility Overal bed mobility: Needs Assistance Bed Mobility: Rolling;Sidelying to Sit Rolling: Min assist Sidelying to sit: Min assist       General bed mobility comments: pt prefers to roll to L side of, MIN A to transition to sidelying, MIN A to elevate trunk into sitting  Transfers Overall transfer level: Needs assistance Equipment used: 1 person hand held assist Transfers: Sit to/from Stand Sit to Stand: Min assist Stand pivot transfers: Min assist       General transfer comment: cues for better use of UE's, pt wants to pull up.  Once up, pt needs the steady assist of IV pole or external assist.  pt later completed multiple sit to/from stand with appropriate UE assist from arm rests.  Ambulation/Gait Ambulation/Gait assistance: Min assist;+2 safety/equipment Gait Distance (Feet): 15 Feet (then additional 12 feet with IV pole and HHA) Assistive device: IV Pole;1 person hand held  assist Gait Pattern/deviations: Step-through pattern;Decreased stride length Gait velocity: Decreased Gait velocity interpretation: <1.31 ft/sec, indicative of household ambulator General Gait Details: tentative steps, stooped posture, needing both IV pole and external assist with 2nd person for 2 iv poles.   Stairs             Wheelchair Mobility    Modified Rankin (Stroke Patients Only)       Balance Overall balance assessment: Needs assistance Sitting-balance support: No upper extremity supported Sitting balance-Leahy Scale: Good     Standing balance support: Single extremity supported;During functional activity Standing balance-Leahy Scale: Poor Standing balance comment: at least one UE supported                            Cognition Arousal/Alertness: Awake/alert Behavior During Therapy: WFL for tasks assessed/performed Overall Cognitive Status: Within Functional Limits for tasks assessed                                        Exercises      General Comments General comments (skin integrity, edema, etc.): pt HR increase to as much as 156 bpm with mobility on RA during session with sats [facility]      Pertinent Vitals/Pain Pain Assessment: Faces Faces Pain Scale: Hurts a little bit Pain Location: R side Pain Descriptors / Indicators: Aching;Operative site guarding Pain Intervention(s): PCA encouraged;Monitored during session;Premedicated before session    Home Living  Prior Function            PT Goals (current goals can now be found in the care plan section) Acute Rehab PT Goals Patient Stated Goal: to decrease pain PT Goal Formulation: With patient Time For Goal Achievement: 09/04/20 Potential to Achieve Goals: Fair Progress towards PT goals: Progressing toward goals    Frequency    Min 3X/week      PT Plan Current plan remains appropriate    Co-evaluation PT/OT/SLP  Co-Evaluation/Treatment: Yes Reason for Co-Treatment: For patient/therapist safety PT goals addressed during session: Mobility/safety with mobility OT goals addressed during session: ADL's and self-care      AM-PAC PT "6 Clicks" Mobility   Outcome Measure  Help needed turning from your back to your side while in a flat bed without using bedrails?: A Little Help needed moving from lying on your back to sitting on the side of a flat bed without using bedrails?: A Little Help needed moving to and from a bed to a chair (including a wheelchair)?: A Little Help needed standing up from a chair using your arms (e.g., wheelchair or bedside chair)?: A Little Help needed to walk in hospital room?: A Little Help needed climbing 3-5 steps with a railing? : A Lot 6 Click Score: 17    End of Session   Activity Tolerance: Patient limited by pain Patient left: with call bell/phone within reach;in chair;with chair alarm set Nurse Communication: Mobility status PT Visit Diagnosis: Other abnormalities of gait and mobility (R26.89);Pain Pain - part of body:  (abdomen)     Time: 5573-2202 PT Time Calculation (min) (ACUTE ONLY): 28 min  Charges:  $Gait Training: 8-22 mins                     08/26/2020  Ginger Carne., PT Acute Rehabilitation Services [phone]  (pager) [phone]  (office)   Tessie Fass Meril Dray 08/26/2020, 6:16 PM

## 2020-08-26 NOTE — Progress Notes (Signed)
Trauma/Critical Care Follow Up Note  Subjective:    Overnight Issues:   Objective:  Vital signs for last 24 hours: Temp:  [97.7 F (36.5 C)-100.4 F (38 C)] 98.1 F (36.7 C) (01/26 0859) Pulse Rate:  [78-103] 98 (01/26 0859) Resp:  [10-22] 19 (01/26 0859) BP: (107-144)/(55-87) 144/66 (01/26 0859) SpO2:  [93 %-97 %] 97 % (01/26 0859) FiO2 (%):  [21 %] 21 % (01/25 1935)  Hemodynamic parameters for last 24 hours:    Intake/Output from previous day: 01/25 0701 - 01/26 0700 In: 668.8 [I.V.:668.8] Out: 895 [Urine:400; Drains:495]  Intake/Output this shift: No intake/output data recorded.  Vent settings for last 24 hours: FiO2 (%):  [21 %] 21 %  Physical Exam:  Gen: comfortable, no distress Neuro: non-focal exam HEENT: PERRL Neck: supple CV: RRR Pulm: unlabored breathing Abd: soft, appropriately TTP, midline vac with good seal, g-tube to gravity, j-tube with feeds, drains x2, SS and bilious  GU: dark urine Extr: wwp, no edema   Results for orders placed or performed during the hospital encounter of 08/21/20 (from the past 24 hour(s))  Glucose, capillary     Status: Abnormal   Collection Time: 08/25/20 12:53 PM  Result Value Ref Range   Glucose-Capillary 155 (H) 70 - 99 mg/dL  Glucose, capillary     Status: Abnormal   Collection Time: 08/25/20  4:48 PM  Result Value Ref Range   Glucose-Capillary 163 (H) 70 - 99 mg/dL   Comment 1 Notify RN    Comment 2 Document in Chart   Glucose, capillary     Status: Abnormal   Collection Time: 08/25/20  8:36 PM  Result Value Ref Range   Glucose-Capillary 145 (H) 70 - 99 mg/dL  Glucose, capillary     Status: Abnormal   Collection Time: 08/26/20 12:00 AM  Result Value Ref Range   Glucose-Capillary 156 (H) 70 - 99 mg/dL  Glucose, capillary     Status: Abnormal   Collection Time: 08/26/20  3:59 AM  Result Value Ref Range   Glucose-Capillary 155 (H) 70 - 99 mg/dL  CBC     Status: Abnormal   Collection Time: 08/26/20  5:00  AM  Result Value Ref Range   WBC 8.9 4.0 - 10.5 K/uL   RBC 3.55 (L) 3.87 - 5.11 MIL/uL   Hemoglobin 11.0 (L) 12.0 - 15.0 g/dL   HCT 32.7 (L) 36.0 - 46.0 %   MCV 92.1 80.0 - 100.0 fL   MCH 31.0 26.0 - 34.0 pg   MCHC 33.6 30.0 - 36.0 g/dL   RDW 13.2 11.5 - 15.5 %   Platelets 241 150 - 400 K/uL   nRBC 0.0 0.0 - 0.2 %  Comprehensive metabolic panel     Status: Abnormal   Collection Time: 08/26/20  5:00 AM  Result Value Ref Range   Sodium 135 135 - 145 mmol/L   Potassium 3.5 3.5 - 5.1 mmol/L   Chloride 103 98 - 111 mmol/L   CO2 25 22 - 32 mmol/L   Glucose, Bld 114 (H) 70 - 99 mg/dL   BUN 14 8 - 23 mg/dL   Creatinine, Ser 0.53 0.44 - 1.00 mg/dL   Calcium 6.6 (L) 8.9 - 10.3 mg/dL   Total Protein 4.2 (L) 6.5 - 8.1 g/dL   Albumin 1.4 (L) 3.5 - 5.0 g/dL   AST 32 15 - 41 U/L   ALT 24 0 - 44 U/L   Alkaline Phosphatase 89 38 - 126 U/L   Total  Bilirubin 1.2 0.3 - 1.2 mg/dL   GFR, Estimated >60 >60 mL/min   Anion gap 7 5 - 15  Magnesium     Status: None   Collection Time: 08/26/20  5:00 AM  Result Value Ref Range   Magnesium 2.3 1.7 - 2.4 mg/dL  Phosphorus     Status: Abnormal   Collection Time: 08/26/20  5:00 AM  Result Value Ref Range   Phosphorus 2.4 (L) 2.5 - 4.6 mg/dL  Glucose, capillary     Status: Abnormal   Collection Time: 08/26/20  9:07 AM  Result Value Ref Range   Glucose-Capillary 160 (H) 70 - 99 mg/dL    Assessment & Plan: The plan of care was discussed with the bedside nurse for the day, Chrys Racer, who is in agreement with this plan and no additional concerns were raised.   Present on Admission: **None**    LOS: 3 days   Additional comments:I reviewed the patient's new clinical lab test results.   and I reviewed the patients new imaging test results.    S/p lap subtotal cholecystectomy for necrotic cholecystitis - POD5 S/p exlap, GJ, g-tube, j-tube, additional drain placement - POD3, performed for leak from duodenum identified on CT A/P Mod to poorly  differentiated adenocarcinoma, presumably gallbladder cancer - CT chest, MR liver performed yesterday, suspect local mets. Palliative consult pending, patient agreeable to Onc c/s today. CA19-9, CEA pending.   Dark urine - seems volume resuscitated, send UA to see if spilling bili Pain control - PCA  ID - cont zosyn, end 1/26 FEN -NPO, okay to have gum/mints, cont TPN today, cont g-tube to gravity, cont trickle j-tube feeds with no advancement, AROBF, replete K and phos DVT - SCDs,LMWH Dispo -4NP, encouraged ambulation/OOB to chair   Jesusita Oka, MD Trauma & General Surgery Please use [email or web address] to contact on call provider  08/26/2020  *Care during the described time interval was provided by me. I have reviewed this patient's available data, including medical history, events of note, physical examination and test results as part of my evaluation.

## 2020-08-26 NOTE — Progress Notes (Signed)
Occupational Therapy Treatment Patient Details Name: Faith Morgan MRN: [id number] DOB: 06-Sep-1950 Today's Date: 08/26/2020    History of present illness Pt is a 70 y/o female s/p lap chole due cholectystitis and necrotic gallbladder. 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 No pertinent PMH.   OT comments  Pt seen in conjunction with PT to maximize pts activity tolerance. Pt continues to present with increased pain, impaired balance,  decreased activity tolerance and increased HR with activity with HR increasing to as much as 156 bpm. Pt required MIN A +2 for safety and equipment mgmt during all functional mobility tasks. Set- up assist for UB grooming tasks and MAX A for LB ADLS. Marland KitchenDc plan remains appropriate, will continue to follow acutely per POC.   Follow Up Recommendations  No OT follow up;Supervision/Assistance - 24 hour    Equipment Recommendations  Tub/shower seat    Recommendations for Other Services      Precautions / Restrictions Precautions Precautions: Other (comment) Precaution Comments: JP drain x 2; jejunostomy, 2 IV poles, wound vac Restrictions Weight Bearing Restrictions: No       Mobility Bed Mobility Overal bed mobility: Needs Assistance Bed Mobility: Rolling;Sidelying to Sit Rolling: Min assist Sidelying to sit: Min assist       General bed mobility comments: pt prefers to roll to L side of, MIN A to transition to sidelying, MIN A to elevate trunk into sitting  Transfers Overall transfer level: Needs assistance Equipment used: 1 person hand held assist Transfers: Sit to/from Stand Sit to Stand: Min assist         General transfer comment: pt continues to be unsteady with mobility reaching out for IV pole. MIN A for inital steadying assist, pt complete mutliple sit<>stands during session    Balance Overall balance assessment:  Needs assistance Sitting-balance support: No upper extremity supported Sitting balance-Leahy Scale: Good     Standing balance support: Single extremity supported;During functional activity Standing balance-Leahy Scale: Poor Standing balance comment: at least one UE supported                           ADL either performed or assessed with clinical judgement   ADL Overall ADL's : Needs assistance/impaired     Grooming: Wash/dry face;Sitting;Set up       Lower Body Bathing: Maximal assistance;Sitting/lateral leans       Lower Body Dressing: Total assistance;Sit to/from stand Lower Body Dressing Details (indicate cue type and reason): to don clean socks Toilet Transfer: Minimal assistance;BSC;+2 for safety/equipment;RW;Ambulation Toilet Transfer Details (indicate cue type and reason): pt able to ambulate to BR with RW and MIN A +2 for safety Toileting- Clothing Manipulation and Hygiene: Maximal assistance;Sitting/lateral lean Toileting - Clothing Manipulation Details (indicate cue type and reason): for cleanliness     Functional mobility during ADLs: Minimal assistance;+2 for safety/equipment General ADL Comments: pt continues to present with increased pain, reports of lightheadness, decreased activity tolerance and increased HR with activity     Vision       Perception     Praxis      Cognition Arousal/Alertness: Awake/alert Behavior During Therapy: WFL for tasks assessed/performed Overall Cognitive Status: Within Functional Limits for tasks assessed  Exercises     Shoulder Instructions       General Comments pt HR increase to as much as 156 bpm with mobility on RA during session with [facility]ats [facility]    Pertinent Vitals/ Pain       Pain Assessment: Faces Faces Pain Scale: Hurts a little bit Pain Location: R side Pain Descriptors / Indicators: Aching;Operative site guarding Pain Intervention(s):  Limited activity within patient's tolerance;Monitored during session;Repositioned  Home Living                                          Prior Functioning/Environment              Frequency  Min 2X/week        Progress Toward Goals  OT Goals(current goals can now be found in the care plan section)  Progress towards OT goals: Progressing toward goals  Acute Rehab OT Goals Patient Stated Goal: to decrease pain OT Goal Formulation: With patient Time For Goal Achievement: 09/03/20 Potential to Achieve Goals: Good  Plan Discharge plan remains appropriate;Frequency remains appropriate    Co-evaluation    PT/OT/SLP Co-Evaluation/Treatment: Yes Reason for Co-Treatment: For patient/therapist safety;To address functional/ADL transfers   OT goals addressed during session: ADL's and self-care      AM-PAC OT "6 Clicks" Daily Activity     Outcome Measure   Help from another person eating meals?: A Little Help from another person taking care of personal grooming?: A Little Help from another person toileting, which includes using toliet, bedpan, or urinal?: A Lot Help from another person bathing (including washing, rinsing, drying)?: A Lot Help from another person to put on and taking off regular upper body clothing?: A Lot Help from another person to put on and taking off regular lower body clothing?: Total 6 Click Score: 13    End of Session Equipment Utilized During Treatment: Other (comment) (IV pole for stability during ambulation)  OT Visit Diagnosis: Pain   Activity Tolerance Patient tolerated treatment well   Patient Left in chair;with call bell/phone within reach;with nursing/sitter in room;Other (comment) (MD enter)   Nurse Communication Mobility status        Time: [phone] OT Time Calculation (min): 28 min  Charges: OT General Charges $OT Visit: 1 Visit OT Treatments $Self Care/Home Management : 8-22 mins  Harley Alto.,  COTA/L Acute Rehabilitation Services [phone]    Precious Haws 08/26/2020, 5:09 PM

## 2020-08-26 NOTE — Progress Notes (Signed)
ABG sample collected and sent to Lab. Lab was called and notified.  

## 2020-08-26 NOTE — Progress Notes (Signed)
Patient seen and examined for new onset tachycardia. Patient asymptomatic, no palpitations, CP, dyspnea, dizziness/lightheadedness. EKG with sinus tach. CXR ordered, stable. UA from this AM not suspicious for UTI, so no cx sent. Appears euvolemic clinically. Will send ABG, bcx, and LE duplex. Has been on appropriate DVT ppx, however malignancy identified this hospitalization which increases her hypercoagulable state, so index of suspicious for PE is higher. Discussed with patient, all questions answered.   Jesusita Oka, MD General [facility]

## 2020-08-26 NOTE — Consult Note (Signed)
Fa[facility] NOTE  Patient Care Team: Kathyrn Lass, MD as PCP - General (Family Medicine)  CHIEF COMPLAINTS/PURPOSE OF CONSULTATION:  Abdominal pain  ASSESSMENT & PLAN:  No problem-specific Assessment & Plan notes found for this encounter.  1.  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 Imaging suggests a 5.2 x 4.5 cm enhancing lesion along the cranial aspect of the gallbladder fossa suggestive of tumor spread into the adjacent liver, 2.1 cm enhancing lesion in segment 4 and 9 mm lesion in the caudate lobe, 10 mm short axis lymph node in the hepatoduodenal ligament. There is no clear evidence of distant metastatic disease. I have discussed that if she continues to heal well and recovers, she can consider neoadjuvant chemotherapy followed by surgery provided there continues to be no evidence of distant metastatic disease.  However patient was very clear that she is not considering kind of chemotherapy or surgery and she would like to proceed with hospice. I asked her if she would like to wait until she recovers for-she was very clear that she would like to go home with hospice when she recovers.  In that case her medical oncology will sign off, please continue discussions with palliative care per patient's request. Thank you for consulting [place] in the care of this patient.  Please not hesitate to contact us with any additional questions or concerns.  HISTORY OF PRESENTING ILLNESS:  Faith Morgan [place] 70 y.o. female who was admitted with right upper quadrant pain sent for outpatient HIDA scan, diagnosed with cholecystitis.  She was admitted for lap chole by Dr. Bobbye Morton.   She tells me that she has had some pain in the right upper quadrant for at least 3 months before her admission.  She went to her PCP, Dr. Have gallstones and ultrasound abdomen was ordered. Ultrasound in November showed echogenic liver, 15 mm lesion in the right hepatic lobe which may  reflect a mildly complex cyst and recommendation was to follow-up in 6 months. She then underwent HIDA scan which showed nonvisualization of gallbladder compatible with cystic duct obstruction due to cholecystitis. She had laparoscopic cholecystectomy on 08/21/2020. 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  She had to go back to the OR and had exploratory laparotomy, gastrojejunostomy, G-tube placement, J-tube placement, omental pedicle flap coverage of large duodenal fistula and additional intra-abdominal drains by Dr. Kae Heller.  She was found to have poorly differentiated adenocarcinoma with focal squamous metaplasia in the gallbladder.  She has requested to not call medical oncology but apparently she wanted to talk with [place] today.  She was resting at the time of my appointment after some PT and OT.  She is completely alert, good historian.  She is not in any distress, pain is reasonably controlled, she is sipping on ice chips.  She is on TPN with some J-tube feeding according to my discussion with her surgeon.  She is ambulating out of bed to chair.  MRI imaging showed 5.2 x 4.5 cm M enhancing lesion along the cranial aspect of the gallbladder fossa showing restricted diffusion and is highly suggestive of tumor spread into the adjacent liver along the gallbladder fossa given the history of adenocarcinoma.  Abscess is considered less likely but not entirely included.  2.1 cm enhancing lesion in segment 4 and 9 mm lesion in the caudate lobe compatible with metastatic involvement.  10 mm short axis lymph node in the hepatoduodenal ligament.  Dilatation of bile ducts in the left and right  liver secondary to involvement evaluation along gallbladder.  Noted.  No dilatation of CBD  Rest of the pertinent 10 point ROS reviewed and unremarkable.   MEDICAL HISTORY:  Past Medical History:  Diagnosis Date  . Eczema   . Liver cyst 07/2020   inconclusive via ultrasound    SURGICAL HISTORY: Past Surgical  History:  Procedure Laterality Date  . CHOLECYSTECTOMY N/A 08/21/2020   Procedure: LAPAROSCOPIC CHOLECYSTECTOMY, SUBTOTAL;  Surgeon: Diamantina Monks, MD;  Location: [facility] OR;  Service: General;  Laterality: N/A;  . COLONOSCOPY    . EYE SURGERY Bilateral 03/2020   cataract removal  . GASTROJEJUNOSTOMY N/A 08/23/2020   Procedure: PYLORIC EXCLUSION; GASTROJEJUNOSTOMY;  Surgeon: Berna Bue, MD;  Location: [facility] OR;  Service: General;  Laterality: N/A;  . GASTROSTOMY Left 08/23/2020   Procedure: INSERTION OF GASTROSTOMY TUBE;  Surgeon: Berna Bue, MD;  [facility];  Service: General;  Laterality: Left;  . JEJUNOSTOMY  08/23/2020   Procedure: Rance Muir;  Surgeon: Berna Bue, MD;  Location: [facility] OR;  Service: General;;  . LAPAROTOMY N/A 08/23/2020   Procedure: EXPLORATORY LAPAROTOMY; OMENTAL PEDICLE FLAP;  Surgeon: Berna Bue, MD;  Location: [facility] OR;  Service: General;  Laterality: N/A;    SOCIAL HISTORY: Social History   Socioeconomic History  . Marital status: Married    Spouse name: Not on file  . Number of children: Not on file  . Years of education: Not on file  . Highest education level: Not on file  Occupational History  . Not on file  Tobacco Use  . Smoking status: Former Games developer  . Smokeless tobacco: Never Used  Vaping Use  . Vaping Use: Never used  Substance and Sexual Activity  . Alcohol use: Never  . Drug use: Never  . Sexual activity: Not on file  Other Topics Concern  . Not on file  Social History Narrative  . Not on file   Social Determinants of Health   Financial Resource Strain: Not on file  Food Insecurity: Not on file  Transportation Needs: Not on file  Physical Activity: Not on file  Stress: Not on file  Social Connections: Not on file  Intimate Partner Violence: Not on file    FAMILY HISTORY: History reviewed. No pertinent family history.  ALLERGIES:  is allergic to crestor [rosuvastatin].  MEDICATIONS:  Current  Facility-Administered Medications  Medication Dose Route Frequency Provider Last Rate Last Admin  . 0.9 %  sodium chloride infusion   Intravenous Continuous von Pearletha Furl, RPH 50 mL/hr at 08/26/20 1735 Rate Change at 08/26/20 1735  . bisacodyl (DULCOLAX) suppository 10 mg  10 mg Rectal Daily PRN Phylliss Blakes A, MD      . busPIRone (BUSPAR) tablet 7.5 mg  7.5 mg Per Tube TID PRN Diamantina Monks, MD      . chlorhexidine (PERIDEX) 0.12 % solution 15 mL  15 mL Mouth Rinse BID Diamantina Monks, MD   15 mL at 08/26/20 1022  . Chlorhexidine Gluconate Cloth 2 % PADS 6 each  6 each Topical Daily Diamantina Monks, MD   6 each at 08/26/20 1023  . diphenhydrAMINE (BENADRYL) 12.5 MG/5ML elixir 12.5 mg  12.5 mg Per Tube Q6H PRN Diamantina Monks, MD       Or  . diphenhydrAMINE (BENADRYL) injection 12.5 mg  12.5 mg Intravenous Q6H PRN Diamantina Monks, MD      . enoxaparin (LOVENOX) injection 40 mg  40 mg Subcutaneous Q24H Fredricka Bonine,  Chelsea A, MD   40 mg at 08/26/20 1029  . feeding supplement (VITAL 1.5 CAL) liquid 1,000 mL  1,000 mL Per Tube Q24H Jesusita Oka, MD 10 mL/hr at 08/26/20 1746 1,000 mL at 08/26/20 1746  . HYDROmorphone (DILAUDID) 1 mg/mL PCA injection   Intravenous Q4H Romana Juniper A, MD   30 mg at 08/25/20 1935  . insulin aspart (novoLOG) injection 0-15 Units  0-15 Units Subcutaneous Q4H Jesusita Oka, MD   5 Units at 08/26/20 1734  . ketorolac (TORADOL) 15 MG/ML injection 15 mg  15 mg Intravenous Q6H Jesusita Oka, MD   15 mg at 08/26/20 1735  . MEDLINE mouth rinse  15 mL Mouth Rinse q12n4p Jesusita Oka, MD   15 mL at 08/26/20 1330  . methocarbamol (ROBAXIN) 750 mg in dextrose 5 % 50 mL IVPB  750 mg Intravenous Q6H Jesusita Oka, MD 100 mL/hr at 08/26/20 1338 750 mg at 08/26/20 1338  . metoprolol tartrate (LOPRESSOR) injection 5 mg  5 mg Intravenous Q6H PRN Jesusita Oka, MD   5 mg at 08/26/20 1442  . naloxone [facility]) injection 0.4 mg  0.4 mg Intravenous PRN Romana Juniper A, MD       And  . sodium chloride flush (NS) 0.9 % injection 9 mL  9 mL Intravenous PRN Romana Juniper A, MD      . ondansetron [facility]) injection 4 mg  4 mg Intravenous Q6H PRN Romana Juniper A, MD   4 mg at 08/26/20 1303  . pantoprazole (PROTONIX) injection 40 mg  40 mg Intravenous Q12H Romana Juniper A, MD   40 mg at 08/26/20 1028  . piperacillin-tazobactam (ZOSYN) IVPB 3.375 g  3.375 g Intravenous Q8H Lovick, Montel Culver, MD 12.5 mL/hr at 08/26/20 1450 3.375 g at 08/26/20 1450  . sodium chloride flush (NS) 0.9 % injection 10-40 mL  10-40 mL Intracatheter PRN Jesusita Oka, MD      . sodium chloride flush (NS) 0.9 % injection 10-40 mL  10-40 mL Intracatheter Q12H Jesusita Oka, MD   10 mL at 08/26/20 1018  . sodium chloride flush (NS) 0.9 % injection 10-40 mL  10-40 mL Intracatheter PRN Jesusita Oka, MD      . TPN ADULT (ION)   Intravenous Continuous TPN von Caren Griffins, RPH 75 mL/hr at 08/26/20 1711 New Bag at 08/26/20 1711     PHYSICAL EXAMINATION:   Vitals:   08/26/20 1600 08/26/20 1625  BP:  104/64  Pulse:  (!) 123  Resp: (!) 22 (!) 21  Temp:  98.2 F (36.8 C)  SpO2: 93% 95%   Filed Weights   08/21/20 0745  Weight: 178 lb (80.7 kg)    GENERAL:alert, no distress and comfortable SKIN: skin color, texture, turgor are normal, no rashes or significant lesions EYES: normal, conjunctiva are pink and non-injected, sclera clear OROPHARYNX: Dry LUNGS: clear to auscultation and percussion with normal breathing effort HEART: regular rate & rhythm and no murmurs and no lower extremity edema ABDOMEN: Multiple drains and tubes, no bowel sounds Musculoskeletal:no cyanosis of digits and no clubbing  PSYCH: alert & oriented x 3 with fluent speech NEURO: no focal motor/sensory deficits  LABORATORY DATA:  I have reviewed the data as listed Lab Results  Component Value Date   WBC 8.9 08/26/2020   HGB 11.0 (L) 08/26/2020   HCT 32.7 (L) 08/26/2020   MCV 92.1  08/26/2020   PLT 241 08/26/2020   @LASTCHEM @  RADIOGRAPHIC STUDIES: I have personally reviewed the radiological images as listed and agreed with the findings in the report. CT CHEST W CONTRAST  Result Date: 08/25/2020 CLINICAL DATA:  70 year old female with history of gastrointestinal cancer. Staging examination. EXAM: CT CHEST WITH CONTRAST TECHNIQUE: Multidetector CT imaging of the chest was performed during intravenous contrast administration. CONTRAST:  107mL OMNIPAQUE IOHEXOL 350 MG/ML SOLN COMPARISON:  No prior chest CT. CT the abdomen and pelvis 08/23/2020. FINDINGS: Cardiovascular: Heart size is normal. There is no significant pericardial fluid, thickening or pericardial calcification. Right upper extremity PICC with tip terminating in the distal superior vena cava. Mediastinum/Nodes: No pathologically enlarged mediastinal or hilar lymph nodes. Some small densely calcified right hilar lymph nodes are incidentally noted. Esophagus is unremarkable in appearance. No axillary lymphadenopathy. Lungs/Pleura: Small right and trace left pleural effusions lying dependently. Extensive passive atelectasis in the lower lobes of the lungs bilaterally. No definite consolidative airspace disease. No suspicious appearing pulmonary nodules or masses are noted. Calcified granuloma in the periphery of the right upper lobe. Upper Abdomen: Irregular area of low attenuation in the central aspect of the liver incompletely imaged (see report from recent CT the abdomen and pelvis 08/23/2020 for full description of findings beneath the diaphragm). Musculoskeletal: Small amount of gas in the inferior aspect of the right chest wall, similar to recent CT the abdomen and pelvis. There are no aggressive appearing lytic or blastic lesions noted in the visualized portions of the skeleton. IMPRESSION: 1. No definitive evidence of metastatic disease to the chest. 2. However, there are bilateral pleural effusions (small on the right  and trace on the left). Attention on follow-up studies is recommended to ensure resolution. The possibility of malignant pleural effusions is not entirely excluded, but not strongly favored on the basis of today's examination. 3. Passive atelectasis in the lower lobes of the lungs bilaterally. 4. Additional incidental findings, as above. Electronically Signed   By: Vinnie Langton M.D.   On: 08/25/2020 09:18   CT ABDOMEN PELVIS W CONTRAST  Result Date: 08/23/2020 CLINICAL DATA:  Status post subtotal cholecystectomy with drain placement for acute cholecystitis on 08/21/2020. Bilious drainage from right-sided surgical drain. EXAM: CT ABDOMEN AND PELVIS WITH CONTRAST TECHNIQUE: Multidetector CT imaging of the abdomen and pelvis was performed using the standard protocol following bolus administration of intravenous contrast. CONTRAST:  139mL OMNIPAQUE IOHEXOL 300 MG/ML  SOLN COMPARISON:  No prior CT studies for comparison. FINDINGS: Lower chest: Small right pleural effusion.  Bibasilar atelectasis. Hepatobiliary: There is air in the gallbladder fossa postoperatively with a tract of air that can be followed to the duodenum suggestive of a fistula with potential focal duodenal ulceration. This potential focal fistula is at the level of the proximal duodenum and appears to be clearly proximal to the ampulla. There is surrounding inflammation and edema within the liver abutting the gallbladder fossa with suspected early pericholecystic abscess formation in regions containing a small amount of air. No discrete liquefied hepatic abscess identified. There are some satellite areas of edema within the adjacent liver. Overall area of hepatic inflammation abutting the gallbladder fossa measures up to 4.5 cm in gross diameter. There is free air abutting the liver as well as some free fluid lateral to the liver which is of high density, consistent with extravasated oral contrast which is largely present in the stomach and  proximal duodenum. Very little oral contrast is present in the proximal jejunum. This would also support potential perforation of the duodenum. Pancreas: Unremarkable. No pancreatic  ductal dilatation or surrounding inflammatory changes. Spleen: Normal in size without focal abnormality. Adrenals/Urinary Tract: Adrenal glands are unremarkable. Kidneys are normal, without renal calculi, focal lesion, or hydronephrosis. Bladder is unremarkable. Stomach/Bowel: No overt bowel obstruction. Pockets of free intraperitoneal air are present throughout the peritoneal cavity and primarily in the upper abdomen in a perihepatic and epigastric location. There also is free air anteriorly in the mid abdomen. Vascular/Lymphatic: No significant vascular findings are present. No enlarged abdominal or pelvic lymph nodes. Reproductive: Uterus and bilateral adnexa are unremarkable. Other: There also is some free air tracking into the right abdominal wall and the subcutaneous fat of the right abdomen with associated surrounding inflammation and pooled high density fluid immediately external to the abdominal wall and adjacent to the course of the surgical drain consistent with oral contrast exiting the drain and leaking into the soft tissues. The surgical drain enters the right peritoneal cavity and ascends lateral to the liver and up to the infradiaphragmatic space above the dome of the liver. Musculoskeletal: No acute or significant osseous findings. IMPRESSION: 1. Air in the gallbladder fossa postoperatively with a tract of air that can be followed to the duodenum suggestive of a fistula with potential focal duodenal ulceration. This potential focal fistula is at the level of the proximal duodenum and is clearly proximal to the ampulla. There is surrounding inflammation and edema within the liver abutting the gallbladder fossa with potential early pericholecystic abscess formation in regions containing a small amount of air. No discrete  liquefied hepatic abscess identified. 2. Pockets of free intraperitoneal air throughout the peritoneal cavity and primarily in the upper abdomen in a perihepatic and epigastric location. There is also some free fluid lateral to the liver which is of high density and likely consistent with extravasated oral contrast which is largely present in the stomach and proximal duodenum. The surgical drain enters the right peritoneal cavity and ascends lateral to the liver and up to the infra diaphragmatic space above the dome of the liver. There also is extravasated oral contrast in the subcutaneous fat immediately superficial to the abdominal wall musculature and along the course of the surgical drain. Air is also present in the right abdominal wall and right abdominal subcutaneous fat. 3. Small right pleural effusion. 4. These results were called by telephone at the time of interpretation on 08/23/2020 at 12:06 PM to Dr. Jens Som, who verbally acknowledged these results. Electronically Signed   By: Aletta Edouard M.D.   On: 08/23/2020 12:12   MR LIVER W WO CONTRAST  Result Date: 08/25/2020 CLINICAL DATA:  Adenocarcinoma of the gallbladder. Status post cholecystectomy. Staging. EXAM: MRI ABDOMEN WITHOUT AND WITH CONTRAST TECHNIQUE: Multiplanar multisequence MR imaging of the abdomen was performed both before and after the administration of intravenous contrast. CONTRAST:  27mL GADAVIST GADOBUTROL 1 MMOL/ML IV SOLN COMPARISON:  CT chest 08/25/2020.  Abdomen/pelvis CT 08/23/2020. FINDINGS: Motion degraded exam. Lower chest: Small bilateral pleural effusions with bibasilar collapse/consolidation in the lower lobes. Hepatobiliary: Markedly heterogeneous liver perfusion. Irregular rim enhancing lesion seen along the cranial aspect of the gallbladder fossa measuring approximately 5.2 x 4.5 cm. This appears centrally necrotic and diffusion imaging shows restricted diffusion in the area of irregular rim enhancement. Just  anterior to this, along the falciform ligament is a 2.1 cm enhancing lesion in segment IV. This T2 hyperintense lesion is well visualized on axial T2 image 17 of series 5 and restricts diffusion, highly suggestive of metastatic involvement. 9 mm T2 intermediate hyperintensity lesion  in the caudate lobe just anteromedial to the IVC (T2 image 12/5) restricts diffusion and is suspicious for metastatic involvement. This lesion appears to have central necrosis with peripheral rim enhancement on postcontrast imaging. Dilatation of bile ducts in the left and right liver evident with the site of obstruction noted at the confluence which is contiguous with the abnormal enhancing tissue along the gallbladder fossa. This tissue markedly attenuates the duct just distal to the confluence where it measures only 1-2 mm in diameter (see axial T2 image 17 of series 5). Coronal SS Common bile duct in the head of the pancreas is nondilated. FSE image 21/3 shows the irregular soft tissue involving the left and right hepatic ducts and common duct distal to the confluence. Pancreas: Multiple cystic foci are identified in the tip of the pancreatic tail, not well evaluated on postcontrast imaging due to motion degradation but no substantial soft tissue component evident. Spleen:  No splenomegaly. No focal mass lesion. Adrenals/Urinary Tract: No adrenal nodule or mass. Central sinus cysts noted in the kidneys bilaterally. Stomach/Bowel: Gastrostomy tube noted in the stomach which is decompressed. No small bowel distention within the visualized abdomen. Mild gaseous distention of colon evident. Vascular/Lymphatic: No abdominal aortic aneurysm. Portal vein and superior mesenteric vein are patent. Splenic vein difficult to visualized due to motion degradation but does appear patent. 10 mm short axis lymph node identified in the hepatoduodenal ligament. Other:  No substantial intraperitoneal free fluid. Musculoskeletal: Extensive edema and  inflammation noted in the right abdominal wall including a rim enhancing collection in the right lateral abdominal wall musculature similar to CT scan from 2 days ago. Right abdominal drain is not well demonstrated by MRI. No focal suspicious marrow enhancement within the visualized bony anatomy. IMPRESSION: 1. 5.2 x 4.5 cm irregular rim enhancing lesion along the cranial aspect of the gallbladder fossa shows restricted diffusion and is highly suggestive of tumor spread into the adjacent liver along the gallbladder fossa given the history of adenocarcinoma. Abscess is considered less likely but not entirely excluded. 2.1 cm enhancing lesion in segment IV and 9 mm lesion in the caudate lobe are compatible with metastatic involvement. 2. Dilatation of the bile ducts in the left and right liver secondary to involvement by the lesion along the gallbladder fossa where the ducts become obliterated just proximal to and at the level of the hepatic duct confluence. This tissue markedly attenuates the intrahepatic segment of the common duct just distal to the confluence. No dilatation of the common bile duct noted in the head of the pancreas. 3. 10 mm short axis lymph node in the hepatoduodenal ligament, indeterminate but suspicious for metastatic disease. Attention on follow-up recommended. 4. Extensive edema and inflammation in the right abdominal wall including a rim enhancing collection in the right lateral abdominal wall musculature compatible with abscess. 5. Previous abdomen/pelvis CT raised the question of fistula from the duodenum. Gas is not well evaluated on MRI and given the substantial motion degradation of today's study, assessment for fistula is limited. 6. Complex cystic lesion in the tail of pancreas without substantial enhancing component. Likely benign. Close attention on follow-up recommended. 7. Small bilateral pleural effusions with bibasilar collapse/consolidation in the lower lobes. Electronically  Signed   By: Misty Stanley M.D.   On: 08/25/2020 12:08   NM Hepato W/EF  Result Date: 08/10/2020 CLINICAL DATA:  History of gallstones. Right upper quadrant pain which radiates to the right posterior abdominal wall EXAM: NUCLEAR MEDICINE HEPATOBILIARY IMAGING TECHNIQUE: Sequential images  of the abdomen were obtained out to 60 minutes following intravenous administration of radiopharmaceutical. RADIOPHARMACEUTICALS:  5.2 mCi Tc-2m  Choletec IV COMPARISON:  None. FINDINGS: Prompt uptake and biliary excretion of activity by the liver is seen. Biliary activity passes into small bowel, consistent with patent common bile duct. Despite imaging for 2 hours and 30 minutes no gallbladder activity visualized. IMPRESSION: 1. Nonvisualization of the gallbladder compatible with cystic duct obstruction due to cholecystitis. 2. These results will be called to the ordering clinician or representative by the Radiologist Assistant, and communication documented in the PACS or [employer]. Electronically Signed   By: Kerby Moors M.D.   On: 08/10/2020 11:06   DG Chest Port 1 View  Result Date: 08/26/2020 CLINICAL DATA:  Respiratory failure EXAM: PORTABLE CHEST 1 VIEW COMPARISON:  03/26/2012 chest radiograph. FINDINGS: Right PICC terminates in the lower third of the SVC. Stable cardiomediastinal silhouette with normal heart size. No pneumothorax. Probable small bilateral pleural effusions, right greater than left. Low lung volumes with mild bibasilar atelectasis. No pulmonary edema. IMPRESSION: Low lung volumes with mild bibasilar atelectasis. Probable small bilateral pleural effusions, right greater than left. Electronically Signed   By: Ilona Sorrel M.D.   On: 08/26/2020 14:36   [place] EKG SITE RITE  Result Date: 08/24/2020 If Site Rite image not attached, placement could not be confirmed due to current cardiac rhythm.  Have reviewed her lab, imaging reports and pathology reports All questions were answered. The  patient knows to call the clinic with any problems, questions or concerns. I spent 50 minutes in the care of this patient including H and P, review of records, counseling and coordination of care.     Benay Pike, MD 08/26/2020 7:22 PM

## 2020-08-26 NOTE — Progress Notes (Signed)
Pt's heartrate up to 140's. Pt denies chest pain, but says she does feel short of breath. When asked, the pt said that she does feel anxious. Dr. Lovick was sent a secure chat to inform her and request something for anxiety. See new orders.   1430: Despite receiving PCA doses, scheduled Toradol and Robaxin, and Buspirone, pt's heartrate in high 130's. Dr. Lovick was messaged again, says she will come to bedside. See new orders for EKG, chest x-ray, and Lopressor PRN. Lopressor given by this RN.   1500: Pt heartrate between 110-120.   Faith Mccabe E Sharell Hilmer, RN  

## 2020-08-27 ENCOUNTER — Inpatient Hospital Stay (HOSPITAL_COMMUNITY): Payer: Medicare HMO

## 2020-08-27 DIAGNOSIS — Z7189 Other specified counseling: Secondary | ICD-10-CM

## 2020-08-27 DIAGNOSIS — R000 Tachycardia, unspecified: Secondary | ICD-10-CM

## 2020-08-27 DIAGNOSIS — Z9049 Acquired absence of other specified parts of digestive tract: Secondary | ICD-10-CM

## 2020-08-27 DIAGNOSIS — Z515 Encounter for palliative care: Secondary | ICD-10-CM | POA: Diagnosis not present

## 2020-08-27 DIAGNOSIS — C801 Malignant (primary) neoplasm, unspecified: Secondary | ICD-10-CM

## 2020-08-27 DIAGNOSIS — Z66 Do not resuscitate: Secondary | ICD-10-CM

## 2020-08-27 DIAGNOSIS — R109 Unspecified abdominal pain: Secondary | ICD-10-CM

## 2020-08-27 LAB — COMPREHENSIVE METABOLIC PANEL
ALT: 30 U/L (ref 0–44)
AST: 34 U/L (ref 15–41)
Albumin: 1.2 g/dL — ABNORMAL LOW (ref 3.5–5.0)
Alkaline Phosphatase: 90 U/L (ref 38–126)
Anion gap: 9 (ref 5–15)
BUN: 21 mg/dL (ref 8–23)
CO2: 23 mmol/L (ref 22–32)
Calcium: 6.8 mg/dL — ABNORMAL LOW (ref 8.9–10.3)
Chloride: 102 mmol/L (ref 98–111)
Creatinine, Ser: 1.4 mg/dL — ABNORMAL HIGH (ref 0.44–1.00)
GFR, Estimated: 40 mL/min — ABNORMAL LOW (ref >60)
Glucose, Bld: 211 mg/dL — ABNORMAL HIGH (ref 70–99)
Potassium: 4.3 mmol/L (ref 3.5–5.1)
Sodium: 134 mmol/L — ABNORMAL LOW (ref 135–145)
Total Bilirubin: 1 mg/dL (ref 0.3–1.2)
Total Protein: 4.3 g/dL — ABNORMAL LOW (ref 6.5–8.1)

## 2020-08-27 LAB — GLUCOSE, CAPILLARY
Glucose-Capillary: 221 mg/dL — ABNORMAL HIGH (ref 70–99)
Glucose-Capillary: 211 mg/dL — ABNORMAL HIGH (ref 70–99)
Glucose-Capillary: 252 mg/dL — ABNORMAL HIGH (ref 70–99)
Glucose-Capillary: 252 mg/dL — ABNORMAL HIGH (ref 70–99)
Glucose-Capillary: 296 mg/dL — ABNORMAL HIGH (ref 70–99)
Glucose-Capillary: 270 mg/dL — ABNORMAL HIGH (ref 70–99)

## 2020-08-27 LAB — CBC
HCT: 35.2 % — ABNORMAL LOW (ref 36.0–46.0)
Hemoglobin: 11.2 g/dL — ABNORMAL LOW (ref 12.0–15.0)
MCH: 30.2 pg (ref 26.0–34.0)
MCHC: 31.8 g/dL (ref 30.0–36.0)
MCV: 94.9 fL (ref 80.0–100.0)
Platelets: 233 10*3/uL (ref 150–400)
RBC: 3.71 MIL/uL — ABNORMAL LOW (ref 3.87–5.11)
RDW: 13.2 % (ref 11.5–15.5)
WBC: 13.6 10*3/uL — ABNORMAL HIGH (ref 4.0–10.5)
nRBC: 0.1 % (ref 0.0–0.2)

## 2020-08-27 LAB — CEA: CEA: 0.8 ng/mL (ref 0.0–4.7)

## 2020-08-27 LAB — PHOSPHORUS: Phosphorus: 3.1 mg/dL (ref 2.5–4.6)

## 2020-08-27 LAB — HEMOGLOBIN A1C
Hgb A1c MFr Bld: 5.5 % (ref 4.8–5.6)
Mean Plasma Glucose: 111.15 mg/dL

## 2020-08-27 LAB — CANCER ANTIGEN 19-9: CA 19-9: 2414 U/mL — ABNORMAL HIGH (ref 0–35)

## 2020-08-27 LAB — MAGNESIUM: Magnesium: 2.4 mg/dL (ref 1.7–2.4)

## 2020-08-27 MED ORDER — INSULIN ASPART 100 UNIT/ML ~~LOC~~ SOLN
0.0000 [IU] | SUBCUTANEOUS | Status: DC
  Administered 2020-08-27 – 2020-08-28 (×4): 11 [IU] via SUBCUTANEOUS
  Administered 2020-08-28: 7 [IU] via SUBCUTANEOUS
  Administered 2020-08-28: 11 [IU] via SUBCUTANEOUS
  Administered 2020-08-28: 7 [IU] via SUBCUTANEOUS
  Administered 2020-08-28: 11 [IU] via SUBCUTANEOUS
  Administered 2020-08-28 – 2020-08-29 (×2): 4 [IU] via SUBCUTANEOUS
  Administered 2020-08-29 (×3): 3 [IU] via SUBCUTANEOUS
  Administered 2020-08-29: 4 [IU] via SUBCUTANEOUS
  Administered 2020-08-30 (×2): 3 [IU] via SUBCUTANEOUS
  Administered 2020-08-31: 4 [IU] via SUBCUTANEOUS
  Administered 2020-08-31 – 2020-09-01 (×4): 3 [IU] via SUBCUTANEOUS

## 2020-08-27 MED ORDER — TRACE MINERALS CU-MN-SE-ZN 300-55-60-3000 MCG/ML IV SOLN
INTRAVENOUS | Status: AC
  Filled 2020-08-27: qty 600

## 2020-08-27 NOTE — Progress Notes (Signed)
Inpatient Diabetes Program Recommendations  AACE/ADA: New Consensus Statement on Inpatient Glycemic Control (2015)  Target Ranges:  Prepandial:   less than 140 mg/dL      Peak postprandial:   less than 180 mg/dL (1-2 hours)      Critically ill patients:  140 - 180 mg/dL   Lab Results  Component Value Date   GLUCAP 252 (H) 08/27/2020   HGBA1C 5.5 08/27/2020    Review of Glycemic Control Results for Faith Morgan, Faith Morgan (MRN [id number]) as of 08/27/2020 16:22  Ref. Range 08/26/2020 23:51 08/27/2020 03:30 08/27/2020 08:18 08/27/2020 12:19  Glucose-Capillary Latest Ref Range: 70 - 99 mg/dL 236 (H) 221 (H) 211 (H) 252 (H)   Home: none Current: Novolog 0-20 units Q4H  Consider adding Levemir 7 units BID.  Thanks, Bronson Curb, MSN, RNC-OB Diabetes Coordinator [phone] (8a-5p)

## 2020-08-27 NOTE — Progress Notes (Signed)
Oncology will sign off Please re consult if needed.

## 2020-08-27 NOTE — Consult Note (Signed)
Consultation Note Date: 08/27/2020   Patient Name: Faith Morgan  DOB: 02-08-1951  MRN: [id number]  Age / Sex: 70 y.o., female  PCP: Kathyrn Lass, MD Referring Physician: Jesusita Oka, MD  Reason for Consultation: Establishing goals of care  HPI/Patient Profile: 70 y.o. female  with no significant past medical history admitted on 08/21/2020 with right upper quadrant pain with outpatient HIDA scan revealing cholecystitis. Admitted for lap chole by Dr. Bobbye Morton. 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 Pathology revealed poorly differentiated adenocarcinoma with focal squamous metaplasia in gallbladder. CT and MRI completed. MRI highly suspicious for metastatic spread to liver, also ? Abscess. Oncology consulted and patient is very clear on her decision against chemotherapy or surgical intervention. She wishes to discharge home with hospice. Palliative medicine consultation for goals of care.   Clinical Assessment and Goals of Care:  I have reviewed medical records including, discussed with care team, and met with patient and daughter, Faith Morgan at bedside to discuss goals of care. Patient awake, alert, oriented and fully able to participate in discussion.   I introduced Palliative Medicine as specialized medical care for people living with serious illness. It focuses on providing relief from the symptoms and stress of a serious illness. The goal is to improve quality of life for both the patient and the family.  We discussed a brief life review of the patient. Lives home with her husband, Faith Morgan. Two daughters and five grandchildren who she was home schooling two of them. No significant past medical history and has been functional leading up to admission.  Abdominal pain started in October.   Discussed course of hospitalization including diagnoses, interventions, plan of care. Patient appreciative of conversations with Dr. Bobbye Morton. She also spoke with oncology last night. Patient and her daughter have a good understanding of her condition.   I attempted to elicit values and goals of care important to the patient. Patient adamantly tells me her decision to forgo oncology workup including chemotherapy or surgery. She wishes to spend whatever time she has left home with family and support of hospice services. She is more worried about her family and does not wish to be a burden on them. Quality of life is important to her. Emotional support provided.   [name] understands and respects her mother's wishes. They also share that the patient's husband and other family members are aware of her condition and decision to return home with hospice. Good support in the home including the patient's husband, daughter, and sister is coming to visit next week to help care for her.   Discussed what home hospice support would look like and hospice philosophy.   Reassured patient and daughter that I will coordinate with Dr. Bobbye Morton on plan since she is still receiving TPN, tube feeds, PCA pump and wound vac. Explained that hospice will not accept her with TPN or likely wound vac. Patient/daughter understand and appreciate the coordination.  Advanced directives, concepts specific to code status, artifical feeding and hydration, and rehospitalization were considered and discussed. Patient presents her documented living will and desire for a natural death. She has clearly documented she would not wish for life-prolonging measures if terminal or incurable. Copy placed in paper chart to be scanned into EMR.   Completed MOST form with patient and daughter. Patient's decisions include: DNR/DNI, comfort focused care plan and initiation of hospice services on discharge, IVF/ABX for time  trial if indicated, and continue short-term feeding tube (since she already has GJ tubes). Electronic [employer] MOST form completed. Durable DNR completed. Copies placed in chart and given to daughter. Daughter understands and respects her mother's decision for DNR/DNI and initiation of hospice.   **Discussed with Dr. Bobbye Morton. She is hopeful to advance diet in the days to come once bowel function returns, enabling [place] to d/c TPN and tube feeds. Dr. Bobbye Morton is also hopeful to discontinue PCA pump. Once patient is tolerating orals, PO/per tube pain regimen can be initiated. Dr. Bobbye Morton aware that home hospice will not accept wound vac. Plan will be to continue wound vac inpatient but transition to wet-to-dry dressings when discharged home with hospice.  Updated patient and daughter on my conversation with Dr. Bobbye Morton.  Answered questions. PMT contact information given. Patient, daughter, and I plan to meet again tomorrow morning around 930.   Updated RN.    SUMMARY OF RECOMMENDATIONS    GOC discussion with patient and daughter.   Patient adamant on her decision against oncology workup/chemotherapy/surgery. She wishes to spend the remainder of her life home with family and support of hospice services. Quality of life is important to her. Daughter and husband respect her decision.  Copy of patient's living will/desire for natural death placed in paper chart for EMR.  MOST form completed. Patient's decisions include: DNR/DNI, comfort focused care and initiation of hospice on discharge, IVF/ABX for time trial, and short-term feeding tube (since patient already with GJ tubes). Electronic [employer] MOST and durable DNR completed.   Continue current plan of care and medical management inpatient per Dr. Bobbye Morton. Working towards diet advancement and eventually discontinuation of TPN, tube feeds, and PCA if she tolerates orals.  Hospice will not accept with wound vac. Continue inpatient with plans to discontinue prior  to discharge. Wound care per Dr. Craig Guess recommendations.   TOC team consulted for home hospice services.   PMT provider will continue to follow and coordinate home with hospice.   Code Status/Advance Care Planning:  DNR  Symptom Management:   Per attending  Palliative Prophylaxis:   Aspiration, Bowel Regimen, Delirium Protocol, Frequent Pain Assessment, Oral Care and Turn Reposition   Psycho-social/Spiritual:   Desire for further Chaplaincy support:yes  Additional Recommendations: Caregiving  Support/Resources, Compassionate Wean Education and Education on Hospice  Prognosis:   Poor prognosis with suspicion for metastatic cancer. Patient wishes to forgo aggressive oncology workup/chemotherapy/surgery.   Discharge Planning: Home with Hospice      Primary Diagnoses: Present on Admission: **None**   I have reviewed the medical record, interviewed the patient and family, and examined the patient. The following aspects are pertinent.  Past Medical History:  Diagnosis Date  . Eczema   . Liver cyst 07/2020   inconclusive via ultrasound   Social History   Socioeconomic History  . Marital status: Married    Spouse name: Not on file  . Number of children: Not on file  . Years of education: Not on file  . Highest education level:  Not on file  Occupational History  . Not on file  Tobacco Use  . Smoking status: Former Research scientist (life sciences)  . Smokeless tobacco: Never Used  Vaping Use  . Vaping Use: Never used  Substance and Sexual Activity  . Alcohol use: Never  . Drug use: Never  . Sexual activity: Not on file  Other Topics Concern  . Not on file  Social History Narrative  . Not on file   Social Determinants of Health   Financial Resource Strain: Not on file  Food Insecurity: Not on file  Transportation Needs: Not on file  Physical Activity: Not on file  Stress: Not on file  Social Connections: Not on file   History reviewed. No pertinent family  history. Scheduled Meds: . chlorhexidine  15 mL Mouth Rinse BID  . Chlorhexidine Gluconate Cloth  6 each Topical Daily  . enoxaparin (LOVENOX) injection  40 mg Subcutaneous Q24H  . feeding supplement (VITAL 1.5 CAL)  1,000 mL Per Tube Q24H  . HYDROmorphone   Intravenous Q4H  . insulin aspart  0-20 Units Subcutaneous Q4H  . mouth rinse  15 mL Mouth Rinse q12n4p  . pantoprazole (PROTONIX) IV  40 mg Intravenous Q12H  . sodium chloride flush  10-40 mL Intracatheter Q12H   Continuous Infusions: . sodium chloride 50 mL/hr at 08/27/20 0627  . piperacillin-tazobactam (ZOSYN)  IV 3.375 g (08/27/20 1343)  . TPN ADULT (ION) 75 mL/hr at 08/27/20 1806   PRN Meds:.bisacodyl, busPIRone, diphenhydrAMINE **OR** diphenhydrAMINE, metoprolol tartrate, naloxone **AND** sodium chloride flush, ondansetron (ZOFRAN) IV, sodium chloride flush, sodium chloride flush Medications Prior to Admission:  Prior to Admission medications   Medication Sig Start Date End Date Taking? Authorizing Provider  Cholecalciferol (DIALYVITE VITAMIN D 5000) 125 MCG (5000 UT) capsule Take 5,000 Units by mouth daily.   Yes [provider]  Multiple Vitamin (MULTIVITAMIN WITH MINERALS) TABS tablet Take 1 tablet by mouth daily.   Yes [provider]   Allergies  Allergen Reactions  . Crestor [Rosuvastatin]     Muscle pain   Review of Systems  Gastrointestinal: Positive for abdominal pain.   Physical Exam Vitals and nursing note reviewed.  Constitutional:      General: She is awake.  Cardiovascular:     Rate and Rhythm: Normal rate.  Pulmonary:     Effort: No tachypnea, accessory muscle usage or respiratory distress.  Abdominal:     Comments: Wound vac, jp drains, G/J tube  Skin:    General: Skin is warm and dry.  Neurological:     Mental Status: She is alert and oriented to person, place, and time.  Psychiatric:        Mood and Affect: Mood normal.        Speech: Speech normal.        Behavior:  Behavior normal.        Cognition and Memory: Cognition normal.     Vital Signs: BP 130/64 (BP Location: Left Arm)   Pulse (!) 134   Temp (!) 96.3 F (35.7 C) (Axillary)   Resp 20   Ht '5\' 1"'  (1.549 m)   Wt 95.8 kg   SpO2 96%   BMI 39.91 kg/m  Pain Scale: 0-10 POSS *See Group Information*: 1-Acceptable,Awake and alert Pain Score: 2    SpO2: SpO2: 96 % O2 Device:SpO2: 96 % O2 Flow Rate: .O2 Flow Rate (L/min): 2 L/min  IO: Intake/output summary:   Intake/Output Summary (Last 24 hours) at 08/27/2020 1833 Last data filed at  08/27/2020 1700 Gross per 24 hour  Intake 3359.97 ml  Output 2735 ml  Net 624.97 ml    LBM: Last BM Date:  (PTA) Baseline Weight: Weight: 80.7 kg Most recent weight: Weight: 95.8 kg     Palliative Assessment/Data: PPS 40%   Flowsheet Rows   Flowsheet Row Most Recent Value  Intake Tab   Referral Department Trauma  Unit at Time of Referral Intermediate Care Unit  Palliative Care Primary Diagnosis Cancer  Palliative Care Type New Palliative care  Reason for referral Clarify Goals of Care  Date first seen by Palliative Care 08/27/20  Clinical Assessment   Palliative Performance Scale Score 40%  Psychosocial & Spiritual Assessment   Palliative Care Outcomes   Patient/Family meeting held? Yes  Who was at the meeting? patient and daughter  Palliative Care Outcomes Clarified goals of care, Counseled regarding hospice, Provided end of life care assistance, Provided advance care planning, Provided psychosocial or spiritual support, Changed CPR status, Completed durable DNR, ACP counseling assistance, Transitioned to hospice      Time In: 1145 Time Out: 8307 Time Total: 90 Greater than 50%  of this time was spent counseling and coordinating care related to the above assessment and plan.  Signed by:  Ihor Dow, DNP, FNP-C Palliative Medicine Team  Phone: [phone] Fax: [phone]   Please contact Palliative Medicine Team phone at [phone]  for questions and concerns.  For individual provider: See Shea Evans

## 2020-08-27 NOTE — Progress Notes (Signed)
Bilateral lower extremity venous study completed.      Please see CV Proc for preliminary results.   Jazmon Kos, RVT  

## 2020-08-27 NOTE — Progress Notes (Signed)
Trauma/Critical Care Follow Up Note  Subjective:    Overnight Issues:   Objective:  Vital signs for last 24 hours: Temp:  [96 F (35.6 C)-98.8 F (37.1 C)] 97.8 F (36.6 C) (01/27 0327) Pulse Rate:  [88-134] 88 (01/27 0327) Resp:  [11-32] 16 (01/27 0355) BP: (104-144)/(52-69) 112/67 (01/27 0327) SpO2:  [83 %-100 %] 97 % (01/27 0355) Weight:  [95.8 kg] 95.8 kg (01/27 0500)  Hemodynamic parameters for last 24 hours:    Intake/Output from previous day: 01/26 0701 - 01/27 0700 In: 1966 [I.V.:1646; NG/GT:120; IV Piggyback:200] Out: 1385 [Drains:1385]  Intake/Output this shift: No intake/output data recorded.  Vent settings for last 24 hours:    Physical Exam:  Gen: comfortable, no distress Neuro: non-focal exam HEENT: PERRL Neck: supple CV: RRR Pulm: unlabored breathing Abd: soft, appropriately TTP, g-tube to gravity, j-tube with trickle feeds, JP x2 GU: clear yellow urine Extr: wwp, no edema   Results for orders placed or performed during the hospital encounter of 08/21/20 (from the past 24 hour(s))  Glucose, capillary     Status: Abnormal   Collection Time: 08/26/20  9:07 AM  Result Value Ref Range   Glucose-Capillary 160 (H) 70 - 99 mg/dL  Urinalysis, Routine w reflex microscopic Urine, Clean Catch     Status: Abnormal   Collection Time: 08/26/20  9:24 AM  Result Value Ref Range   Color, Urine AMBER (A) YELLOW   APPearance CLEAR CLEAR   Specific Gravity, Urine 1.045 (H) 1.005 - 1.030   pH 5.0 5.0 - 8.0   Glucose, UA 50 (A) NEGATIVE mg/dL   Hgb urine dipstick SMALL (A) NEGATIVE   Bilirubin Urine NEGATIVE NEGATIVE   Ketones, ur NEGATIVE NEGATIVE mg/dL   Protein, ur 30 (A) NEGATIVE mg/dL   Nitrite NEGATIVE NEGATIVE   Leukocytes,Ua NEGATIVE NEGATIVE   RBC / HPF 11-20 0 - 5 RBC/hpf   WBC, UA 0-5 0 - 5 WBC/hpf   Bacteria, UA NONE SEEN NONE SEEN   Squamous Epithelial / LPF 11-20 0 - 5  Glucose, capillary     Status: Abnormal   Collection Time: 08/26/20   1:18 PM  Result Value Ref Range   Glucose-Capillary 170 (H) 70 - 99 mg/dL   Comment 1 Notify RN   Glucose, capillary     Status: Abnormal   Collection Time: 08/26/20  4:21 PM  Result Value Ref Range   Glucose-Capillary 212 (H) 70 - 99 mg/dL  Blood gas, arterial     Status: Abnormal   Collection Time: 08/26/20  5:57 PM  Result Value Ref Range   FIO2 21.00    pH, Arterial 7.430 7.350 - 7.450   pCO2 arterial 35.5 32.0 - 48.0 mmHg   pO2, Arterial 64.8 (L) 83.0 - 108.0 mmHg   Bicarbonate 23.2 20.0 - 28.0 mmol/L   Acid-base deficit 0.6 0.0 - 2.0 mmol/L   O2 Saturation 92.5 %   Patient temperature 37.0    Collection site LEFT RADIAL    Drawn by COLLECTED BY RT    Sample type ARTERIAL DRAW    Allens test (pass/fail) PASS PASS  Glucose, capillary     Status: Abnormal   Collection Time: 08/26/20  8:00 PM  Result Value Ref Range   Glucose-Capillary 234 (H) 70 - 99 mg/dL  Glucose, capillary     Status: Abnormal   Collection Time: 08/26/20 11:51 PM  Result Value Ref Range   Glucose-Capillary 236 (H) 70 - 99 mg/dL  Glucose, capillary  Status: Abnormal   Collection Time: 08/27/20  3:30 AM  Result Value Ref Range   Glucose-Capillary 221 (H) 70 - 99 mg/dL  CBC     Status: Abnormal   Collection Time: 08/27/20  4:19 AM  Result Value Ref Range   WBC 13.6 (H) 4.0 - 10.5 K/uL   RBC 3.71 (L) 3.87 - 5.11 MIL/uL   Hemoglobin 11.2 (L) 12.0 - 15.0 g/dL   HCT 35.2 (L) 36.0 - 46.0 %   MCV 94.9 80.0 - 100.0 fL   MCH 30.2 26.0 - 34.0 pg   MCHC 31.8 30.0 - 36.0 g/dL   RDW 13.2 11.5 - 15.5 %   Platelets 233 150 - 400 K/uL   nRBC 0.1 0.0 - 0.2 %  Comprehensive metabolic panel     Status: Abnormal   Collection Time: 08/27/20  4:19 AM  Result Value Ref Range   Sodium 134 (L) 135 - 145 mmol/L   Potassium 4.3 3.5 - 5.1 mmol/L   Chloride 102 98 - 111 mmol/L   CO2 23 22 - 32 mmol/L   Glucose, Bld 211 (H) 70 - 99 mg/dL   BUN 21 8 - 23 mg/dL   Creatinine, Ser 1.40 (H) 0.44 - 1.00 mg/dL   Calcium  6.8 (L) 8.9 - 10.3 mg/dL   Total Protein 4.3 (L) 6.5 - 8.1 g/dL   Albumin 1.2 (L) 3.5 - 5.0 g/dL   AST 34 15 - 41 U/L   ALT 30 0 - 44 U/L   Alkaline Phosphatase 90 38 - 126 U/L   Total Bilirubin 1.0 0.3 - 1.2 mg/dL   GFR, Estimated 40 (L) >60 mL/min   Anion gap 9 5 - 15  Magnesium     Status: None   Collection Time: 08/27/20  4:19 AM  Result Value Ref Range   Magnesium 2.4 1.7 - 2.4 mg/dL  Phosphorus     Status: None   Collection Time: 08/27/20  4:19 AM  Result Value Ref Range   Phosphorus 3.1 2.5 - 4.6 mg/dL    Assessment & Plan: The plan of care was discussed with the bedside nurse for the day, who is in agreement with this plan and no additional concerns were raised.   Present on Admission: **None**    LOS: 4 days   Additional comments:I reviewed the patient's new clinical lab test results.   and I reviewed the patients new imaging test results.    S/p lap subtotal cholecystectomy for necrotic cholecystitis - POD6 S/p exlap, GJ, g-tube, j-tube, additional drain placement - POD4, performed for leak from duodenum identified on CT A/P Mod to poorly differentiated adenocarcinoma, presumably gallbladder cancer - CT chest, MR liver performed yesterday, suspect local mets. Palliative consult pending, Onc c/s yest, declining chemo/XRT or further surgery. CA19-9 elevated, CEA normal.  Dark urine - seems volume resuscitated Pain control - PCA  ID - cont zosyn FEN - CLD, cont TPN today, cont g-tube to gravity, cont trickle j-tube feeds with no advancement, AROBF DVT - SCDs, LMWH Dispo - 4NP, encouraged ambulation/OOB to chair   Jesusita Oka, MD Trauma & General Surgery Please use [email or web address] to contact on call provider  08/27/2020  *Care during the described time interval was provided by me. I have reviewed this patient's available data, including medical history, events of note, physical examination and test results as part of my evaluation.

## 2020-08-27 NOTE — Progress Notes (Signed)
PHARMACY - TOTAL PARENTERAL NUTRITION CONSULT NOTE   Indication: expected Prolonged ileus  Patient Measurements: Height: 5\' 1"  (154.9 cm) Weight: 95.8 kg (211 lb 3.2 oz) IBW/kg (Calculated) : 47.8 TPN AdjBW (KG): 56 Body mass index is 39.91 kg/m.  Assessment: 39 yof presenting 1/21 with cholecystitis. 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 S/p laparotomy for washout, additional drain placement, G-tube, G-tube, pyloric exclusion with gastrojejunostomy on 1/23. Path resulted 1/24 with moderate to poorly differentiated adenocarcinoma. Pharmacy consulted to start TPN for expected prolonged ileus. Previously on CLD this admit, now NPO.  Glucose / Insulin: No hx DM, CBGs controlled prior to TPN start. CBGs trend up 160-230s. 26 units SSI utilized in last 24hrs Electrolytes: Na 134, K 3.5>4.3, Phos 2.4>3.1 (s/p KPhos 55mmol x 1); others WNL Renal: AKI - SCr 0.53>1.4, BUN WNL LFTs / TGs: LFTs / Tbili wnl. No TG yet Prealbumin / albumin: Prealbumin <5; albumin 1.2 Intake / Output; MIVF: Drain output 530ml/24hrs; UOP charted as 0 ml/kg/hr; MIVF: NS at 4ml/hr; net +1.7L this admit GI Imaging: 1/25 CT chest - no definitive evidence of metastatic disease to chest; bilateral pleural effusions 1/25 MRI liver - highly suggestive of tumor spread into adjacent liver along gallbladder fossa, suspicious for metastatic disease, R abdominal wall abscess, ?fistula from duodenum Surgeries / Procedures: none since TPN start  Central access: PICC placement pending 08/24/20 TPN start date: 08/24/20  Nutritional Goals (per RD recommendation on 08/24/20): Estimated - kCal: 1800-2000, Protein: 90-100g, Fluid: >/=1.8L  Goal TPN rate is 75 mL/hr (provides 90g of protein and 1829kcals per day)  Current Nutrition:  NPO Trickle J-tube feeds with no advancement started 1/25 - Vital 1.5 at 10 ml/hr (continuing same 1/27 per Dr. Bobbye Morton)  Plan:  Continue TPN at goal rate 70mL/hr  at 1800. *Per discussion with dietitian, patient ok for no lipids for 7-10 days due to national shortage protocol (hold until at least 1/31). Clinisol utilized due to Producer, television/film/video. Electrolytes in TPN: reduce K to 35 mEq/L and Phos to 15 mmol/L with AKI; otherwise, continue same today - 74mEq/L of Na, 37mEq/L of Ca, and 82mEq/L of Mg. Cl:Ac ratio 1:1 Add standard MVI and trace elements to TPN Change to resistant q4h SSI and adjust as needed. May need to add insulin to TPN bag on 1/28 if CBGs remain uncontrolled Continue NS at 50 mL/hr per MD Monitor TPN labs F/u Surgery plans for tube feed advancement/toleration and ability to wean TPN D/c Ketorolac and IV Robaxin with AKI per discussion with Dr. Dagoberto Reef, PharmD, BCPS Please check AMION for all [place] contact numbers Clinical Pharmacist 08/27/2020 7:55 AM

## 2020-08-27 NOTE — Progress Notes (Signed)
Physical Therapy Treatment Patient Details Name: Faith Morgan MRN: [id number] DOB: Feb 15, 1951 Today's Date: 08/27/2020    History of Present Illness Pt is a 70 y/o female s/p lap chole due cholectystitis and necrotic gallbladder. 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 No pertinent PMH.    PT Comments    Pt slowly progressing toward goals.  Emphasis on transition, sit to stand and progressing gait stability, lately going to/from the toilet, but plan is to use RW to push the distance and work on her stamina.  Pt reports fatigue after these shorter distances and EHR is up into the 150's max and sustained with activity, lowering into the 130's after activity.    Follow Up Recommendations  No PT follow up     Equipment Recommendations  None recommended by PT    Recommendations for Other Services       Precautions / Restrictions Precautions Precaution Comments: JP drain x 2; jejunostomy, 2 IV poles, wound vac    Mobility  Bed Mobility Overal bed mobility: Needs Assistance Bed Mobility: Rolling;Sidelying to Sit Rolling: Min assist Sidelying to sit: Min assist       General bed mobility comments: most of the assist was being a stationary object to pull on , but pt needed support to being R UE into an appropriate position to assist.  Transfers Overall transfer level: Needs assistance Equipment used: 1 person hand held assist Transfers: Sit to/from Stand Sit to Stand: Min assist         General transfer comment: cues for hand placement and stability once upright.  Ambulation/Gait Ambulation/Gait assistance: Min assist;+2 safety/equipment Gait Distance (Feet): 15 Feet (x2 with/without use of the IV pole.) Assistive device: IV Pole;1 person hand held assist Gait Pattern/deviations: Step-through pattern;Decreased stride length   Gait velocity  interpretation: <1.31 ft/sec, indicative of household ambulator General Gait Details: tentative weak steps, stooped posture, preferring both IV pole and external assist with 2nd person for 2 iv poles.   Stairs             Wheelchair Mobility    Modified Rankin (Stroke Patients Only)       Balance     Sitting balance-Leahy Scale: Good       Standing balance-Leahy Scale: Poor Standing balance comment: at least one UE supported                            Cognition Arousal/Alertness: Awake/alert Behavior During Therapy: WFL for tasks assessed/performed Overall Cognitive Status: Within Functional Limits for tasks assessed                                        Exercises      General Comments General comments (skin integrity, edema, etc.): HR into the upper 140's to 150's      Pertinent Vitals/Pain Pain Assessment: Faces Faces Pain Scale: Hurts little more Pain Location: R side Pain Descriptors / Indicators: Aching;Operative site guarding Pain Intervention(s): Monitored during session;Premedicated before session    Home Living                      Prior Function            PT Goals (current goals can now be found in the  care plan section) Acute Rehab PT Goals PT Goal Formulation: With patient Time For Goal Achievement: 09/04/20 Potential to Achieve Goals: Fair Progress towards PT goals: Progressing toward goals    Frequency    Min 3X/week      PT Plan Current plan remains appropriate    Co-evaluation              AM-PAC PT "6 Clicks" Mobility   Outcome Measure  Help needed turning from your back to your side while in a flat bed without using bedrails?: A Little Help needed moving from lying on your back to sitting on the side of a flat bed without using bedrails?: A Little Help needed moving to and from a bed to a chair (including a wheelchair)?: A Little Help needed standing up from a chair using  your arms (e.g., wheelchair or bedside chair)?: A Little Help needed to walk in hospital room?: A Little Help needed climbing 3-5 steps with a railing? : A Little 6 Click Score: 18    End of Session   Activity Tolerance: Patient limited by fatigue;Patient tolerated treatment well Patient left: with call bell/phone within reach;in chair;with chair alarm set Nurse Communication: Mobility status PT Visit Diagnosis: Other abnormalities of gait and mobility (R26.89);Pain Pain - part of body:  (abdomen)     Time: 6333-5456 PT Time Calculation (min) (ACUTE ONLY): 27 min  Charges:  $Gait Training: 8-22 mins $Therapeutic Activity: 8-22 mins                     08/27/2020  Ginger Carne., PT Acute Rehabilitation Services [phone]  (pager) [phone]  (office)   [name] 08/27/2020, 5:01 PM

## 2020-08-28 DIAGNOSIS — C801 Malignant (primary) neoplasm, unspecified: Secondary | ICD-10-CM | POA: Diagnosis not present

## 2020-08-28 DIAGNOSIS — Z515 Encounter for palliative care: Secondary | ICD-10-CM | POA: Diagnosis not present

## 2020-08-28 DIAGNOSIS — Z9049 Acquired absence of other specified parts of digestive tract: Secondary | ICD-10-CM | POA: Diagnosis not present

## 2020-08-28 DIAGNOSIS — R109 Unspecified abdominal pain: Secondary | ICD-10-CM | POA: Diagnosis not present

## 2020-08-28 LAB — CBC
HCT: 31.1 % — ABNORMAL LOW (ref 36.0–46.0)
Hemoglobin: 9.6 g/dL — ABNORMAL LOW (ref 12.0–15.0)
MCH: 29.3 pg (ref 26.0–34.0)
MCHC: 30.9 g/dL (ref 30.0–36.0)
MCV: 94.8 fL (ref 80.0–100.0)
Platelets: 262 10*3/uL (ref 150–400)
RBC: 3.28 MIL/uL — ABNORMAL LOW (ref 3.87–5.11)
RDW: 13.4 % (ref 11.5–15.5)
WBC: 18.3 10*3/uL — ABNORMAL HIGH (ref 4.0–10.5)
nRBC: 0 % (ref 0.0–0.2)

## 2020-08-28 LAB — BASIC METABOLIC PANEL
Anion gap: 11 (ref 5–15)
BUN: 36 mg/dL — ABNORMAL HIGH (ref 8–23)
CO2: 20 mmol/L — ABNORMAL LOW (ref 22–32)
Calcium: 7 mg/dL — ABNORMAL LOW (ref 8.9–10.3)
Chloride: 99 mmol/L (ref 98–111)
Creatinine, Ser: 2.37 mg/dL — ABNORMAL HIGH (ref 0.44–1.00)
GFR, Estimated: 22 mL/min — ABNORMAL LOW (ref >60)
Glucose, Bld: 190 mg/dL — ABNORMAL HIGH (ref 70–99)
Potassium: 4.1 mmol/L (ref 3.5–5.1)
Sodium: 130 mmol/L — ABNORMAL LOW (ref 135–145)

## 2020-08-28 LAB — COMPREHENSIVE METABOLIC PANEL
ALT: 28 U/L (ref 0–44)
AST: 30 U/L (ref 15–41)
Albumin: 1.2 g/dL — ABNORMAL LOW (ref 3.5–5.0)
Alkaline Phosphatase: 102 U/L (ref 38–126)
Anion gap: 10 (ref 5–15)
BUN: 31 mg/dL — ABNORMAL HIGH (ref 8–23)
CO2: 22 mmol/L (ref 22–32)
Calcium: 7.1 mg/dL — ABNORMAL LOW (ref 8.9–10.3)
Chloride: 103 mmol/L (ref 98–111)
Creatinine, Ser: 2 mg/dL — ABNORMAL HIGH (ref 0.44–1.00)
GFR, Estimated: 26 mL/min — ABNORMAL LOW (ref >60)
Glucose, Bld: 210 mg/dL — ABNORMAL HIGH (ref 70–99)
Potassium: 4.2 mmol/L (ref 3.5–5.1)
Sodium: 135 mmol/L (ref 135–145)
Total Bilirubin: 1 mg/dL (ref 0.3–1.2)
Total Protein: 4.6 g/dL — ABNORMAL LOW (ref 6.5–8.1)

## 2020-08-28 LAB — GLUCOSE, CAPILLARY
Glucose-Capillary: 219 mg/dL — ABNORMAL HIGH (ref 70–99)
Glucose-Capillary: 222 mg/dL — ABNORMAL HIGH (ref 70–99)
Glucose-Capillary: 276 mg/dL — ABNORMAL HIGH (ref 70–99)
Glucose-Capillary: 293 mg/dL — ABNORMAL HIGH (ref 70–99)
Glucose-Capillary: 199 mg/dL — ABNORMAL HIGH (ref 70–99)
Glucose-Capillary: 153 mg/dL — ABNORMAL HIGH (ref 70–99)

## 2020-08-28 LAB — TRIGLYCERIDES: Triglycerides: 100 mg/dL (ref <150)

## 2020-08-28 LAB — MAGNESIUM: Magnesium: 2.4 mg/dL (ref 1.7–2.4)

## 2020-08-28 LAB — PHOSPHORUS: Phosphorus: 2.8 mg/dL (ref 2.5–4.6)

## 2020-08-28 MED ORDER — ENOXAPARIN SODIUM 30 MG/0.3ML ~~LOC~~ SOLN
30.0000 mg | SUBCUTANEOUS | Status: DC
  Administered 2020-08-28 – 2020-09-01 (×5): 30 mg via SUBCUTANEOUS
  Filled 2020-08-28 (×5): qty 0.3

## 2020-08-28 MED ORDER — TRACE MINERALS CU-MN-SE-ZN 300-55-60-3000 MCG/ML IV SOLN
INTRAVENOUS | Status: DC
  Filled 2020-08-28: qty 280

## 2020-08-28 MED ORDER — HYDROMORPHONE HCL 1 MG/ML IJ SOLN
0.5000 mg | INTRAMUSCULAR | Status: DC | PRN
  Administered 2020-08-28: 0.5 mg via INTRAVENOUS
  Filled 2020-08-28: qty 1

## 2020-08-28 MED ORDER — METHOCARBAMOL 500 MG PO TABS
1000.0000 mg | ORAL_TABLET | Freq: Three times a day (TID) | ORAL | Status: DC
  Administered 2020-08-28 – 2020-09-01 (×13): 1000 mg via ORAL
  Filled 2020-08-28 (×14): qty 2

## 2020-08-28 MED ORDER — INSULIN DETEMIR 100 UNIT/ML ~~LOC~~ SOLN
7.0000 [IU] | Freq: Two times a day (BID) | SUBCUTANEOUS | Status: AC
  Administered 2020-08-28 (×2): 7 [IU] via SUBCUTANEOUS
  Filled 2020-08-28 (×2): qty 0.07

## 2020-08-28 MED ORDER — ACETAMINOPHEN 500 MG PO TABS
1000.0000 mg | ORAL_TABLET | Freq: Four times a day (QID) | ORAL | Status: DC
  Administered 2020-08-28 – 2020-09-01 (×15): 1000 mg via ORAL
  Filled 2020-08-28 (×16): qty 2

## 2020-08-28 MED ORDER — HYDROMORPHONE 1 MG/ML IV SOLN
INTRAVENOUS | Status: DC
  Administered 2020-08-28: 30 mg via INTRAVENOUS
  Filled 2020-08-28: qty 30

## 2020-08-28 MED ORDER — ENSURE ENLIVE PO LIQD
237.0000 mL | Freq: Three times a day (TID) | ORAL | Status: DC
  Administered 2020-08-28 – 2020-09-01 (×4): 237 mL via ORAL

## 2020-08-28 MED ORDER — LACTATED RINGERS IV BOLUS
1000.0000 mL | Freq: Once | INTRAVENOUS | Status: AC
  Administered 2020-08-28: 1000 mL via INTRAVENOUS

## 2020-08-28 MED ORDER — OXYCODONE HCL 5 MG PO TABS
5.0000 mg | ORAL_TABLET | ORAL | Status: DC | PRN
  Administered 2020-08-28 – 2020-08-31 (×7): 10 mg via ORAL
  Filled 2020-08-28 (×7): qty 2

## 2020-08-28 NOTE — Progress Notes (Signed)
Nutrition Follow-up  DOCUMENTATION CODES:   Obesity unspecified  INTERVENTION:  TPN per Pharmacy.   Continue Trickle tube feeds using Vital 1.5 cal formula via Jtube at rate of 10 ml/hr.   Provide Ensure Enlive po TID, each supplement provides 350 kcal and 20 grams of protein.  Encourage PO intake.   NUTRITION DIAGNOSIS:   Inadequate oral intake related to inability to eat as evidenced by NPO status; diet advanced; progressing  GOAL:   Patient will meet greater than or equal to 90% of their needs; progressing  MONITOR:   TF tolerance,Skin,Weight trends,Labs,I & O's,Other (Comment) (TPN tolerance)  REASON FOR ASSESSMENT:   Consult New TPN/TNA  ASSESSMENT:   55 yof presenting 1/21 with cholecystitis. 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 S/p laparotomy for washout, additional drain placement, G-tube, G-tube, pyloric exclusion with gastrojejunostomy on 1/23. Pathology revealed poorly differentiated adenocarcinoma with focal squamous metaplasia in gallbladder.   1/21-laparoscopicsubtotalcholecystectomy with drain placement 1/23- s/p ex lap,abdominal washout, pyloric exclusion, retrocolic retrogastric gastrojejunostomy, gastrostomy tube placement, jejunostomy tube placement, omental pedicle flap coverage of large duodenal fistula, placement of additional intra-abdominal drains    Per Palliative care, pt requesting home with hospice. Pt adamant against aggressive oncology workup/chemotherapy/surgery. Per MD, plans to work towards diet advancement with discontinuation of TPN and tube feeds. Plan to continue short term feeding and on comfort focused care. Diet as been advanced to full liquids. Pt reports she was able to tolerate her breakfast this morning. Meal completion </=25%. Pt reports currently dislike of hot items of meal tray. RD to order Ensure to aid in PO. Per Pharmacy note, plans to wean TPN to rate of 35 ml/hr today.   Labs  and medications reviewed.   Diet Order:   Diet Order            Diet full liquid Room service appropriate? Yes; Fluid consistency: Thin  Diet effective now                 EDUCATION NEEDS:   Not appropriate for education at this time  Skin:  Skin Assessment: Reviewed RN Assessment Skin Integrity Issues:: Incisions,Wound VAC Wound Vac: abdomen Incisions: abdomen  Last BM:  1/27  Height:   Ht Readings from Last 1 Encounters:  08/21/20 5\' 1"  (1.549 m)    Weight:   Wt Readings from Last 1 Encounters:  08/28/20 99.1 kg    BMI:  Body mass index is 41.28 kg/m.  Estimated Nutritional Needs:   Kcal:  1800-2100  Protein:  90-110 grams  Fluid:  >/= 1.8 L/day  Corrin Parker, MS, RD, LDN RD pager number/after hours weekend pager number on Amion.

## 2020-08-28 NOTE — Progress Notes (Addendum)
Patients J.P. drain dressing was saturated with sanguinous drainage moderately throughout the entire dressing and dripping down her side. Notified physician of drainage, cleansed site and changed dressing with Oren Beckmann. Physician said she would look at it when she came by.  Shelbie Proctor, RN

## 2020-08-28 NOTE — Consult Note (Signed)
[place] Nurse Consult Note: Patient receiving care in [facility] [id number] Reason for Consult: Discontinue wound vac Bedside RN may discontinue the wound vac and change abdominal surgical wound dressing to moistened saline gauze, cover with 4 x 4 and ABD pads change twice daily. WOC will sign off  Monitor the wound area(s) for worsening of condition such as: Signs/symptoms of infection, increase in size, development of or worsening of odor, development of pain, or increased pain at the affected locations.   Notify the medical team if any of these develop.  Thank you for the consult. [place] nurse will not follow at this time.   Please re-consult the [place] team if needed.  Cathlean Marseilles Tamala Julian, MSN, RN, [place], Lysle Pearl, [facility] W[facility] Treatment Associate Pager [phone]

## 2020-08-28 NOTE — Progress Notes (Signed)
Wasted 15 ml of Dilaudid plus PCA tubing with Oren Beckmann.   Shelbie Proctor, RN

## 2020-08-28 NOTE — Progress Notes (Signed)
General Surgery Follow Up Note  Subjective:    Overnight Issues:   Objective:  Vital signs for last 24 hours: Temp:  [96.3 F (35.7 C)-98.6 F (37 C)] 98.1 F (36.7 C) (01/28 1244) Pulse Rate:  [98-134] 100 (01/28 1244) Resp:  [15-27] 21 (01/28 1244) BP: (115-138)/(54-75) 138/54 (01/28 1244) SpO2:  [94 %-100 %] 96 % (01/28 1244) Weight:  [99.1 kg] 99.1 kg (01/28 0500)  Hemodynamic parameters for last 24 hours:    Intake/Output from previous day: 01/27 0701 - 01/28 0700 In: 3384 [P.O.:637; I.V.:2277; NG/GT:320; IV Piggyback:150] Out: 1995 [Drains:1995]  Intake/Output this shift: Total I/O In: 10 [I.V.:10] Out: -   Vent settings for last 24 hours:    Physical Exam:  Gen: comfortable, no distress Neuro: non-focal exam HEENT: PERRL Neck: supple CV: RRR Pulm: unlabored breathing Abd: soft, appropriately TTP GU: dark urine Extr: wwp, no edema   Results for orders placed or performed during the hospital encounter of 08/21/20 (from the past 24 hour(s))  Glucose, capillary     Status: Abnormal   Collection Time: 08/27/20  4:51 PM  Result Value Ref Range   Glucose-Capillary 252 (H) 70 - 99 mg/dL  Glucose, capillary     Status: Abnormal   Collection Time: 08/27/20  8:21 PM  Result Value Ref Range   Glucose-Capillary 296 (H) 70 - 99 mg/dL  Glucose, capillary     Status: Abnormal   Collection Time: 08/27/20 11:12 PM  Result Value Ref Range   Glucose-Capillary 270 (H) 70 - 99 mg/dL  Glucose, capillary     Status: Abnormal   Collection Time: 08/28/20  4:23 AM  Result Value Ref Range   Glucose-Capillary 219 (H) 70 - 99 mg/dL  CBC     Status: Abnormal   Collection Time: 08/28/20  5:00 AM  Result Value Ref Range   WBC 18.3 (H) 4.0 - 10.5 K/uL   RBC 3.28 (L) 3.87 - 5.11 MIL/uL   Hemoglobin 9.6 (L) 12.0 - 15.0 g/dL   HCT 31.1 (L) 36.0 - 46.0 %   MCV 94.8 80.0 - 100.0 fL   MCH 29.3 26.0 - 34.0 pg   MCHC 30.9 30.0 - 36.0 g/dL   RDW 13.4 11.5 - 15.5 %   Platelets  262 150 - 400 K/uL   nRBC 0.0 0.0 - 0.2 %  Comprehensive metabolic panel     Status: Abnormal   Collection Time: 08/28/20  5:00 AM  Result Value Ref Range   Sodium 135 135 - 145 mmol/L   Potassium 4.2 3.5 - 5.1 mmol/L   Chloride 103 98 - 111 mmol/L   CO2 22 22 - 32 mmol/L   Glucose, Bld 210 (H) 70 - 99 mg/dL   BUN 31 (H) 8 - 23 mg/dL   Creatinine, Ser 2.00 (H) 0.44 - 1.00 mg/dL   Calcium 7.1 (L) 8.9 - 10.3 mg/dL   Total Protein 4.6 (L) 6.5 - 8.1 g/dL   Albumin 1.2 (L) 3.5 - 5.0 g/dL   AST 30 15 - 41 U/L   ALT 28 0 - 44 U/L   Alkaline Phosphatase 102 38 - 126 U/L   Total Bilirubin 1.0 0.3 - 1.2 mg/dL   GFR, Estimated 26 (L) >60 mL/min   Anion gap 10 5 - 15  Triglycerides     Status: None   Collection Time: 08/28/20  5:00 AM  Result Value Ref Range   Triglycerides 100 <150 mg/dL  Magnesium     Status: None  Collection Time: 08/28/20  5:00 AM  Result Value Ref Range   Magnesium 2.4 1.7 - 2.4 mg/dL  Phosphorus     Status: None   Collection Time: 08/28/20  5:00 AM  Result Value Ref Range   Phosphorus 2.8 2.5 - 4.6 mg/dL  Glucose, capillary     Status: Abnormal   Collection Time: 08/28/20  8:47 AM  Result Value Ref Range   Glucose-Capillary 222 (H) 70 - 99 mg/dL  Glucose, capillary     Status: Abnormal   Collection Time: 08/28/20 12:26 PM  Result Value Ref Range   Glucose-Capillary 276 (H) 70 - 99 mg/dL    Assessment & Plan: The plan of care was discussed with the bedside nurse for the day, Lu Duffel, who is in agreement with this plan and no additional concerns were raised.   Present on Admission: **None**    LOS: 5 days   Additional comments:I reviewed the patient's new clinical lab test results.   and I reviewed the patients new imaging test results.    S/p lap subtotal cholecystectomy for necrotic cholecystitis - POD7 S/p exlap, GJ, g-tube, j-tube, additional drain placement - POD5, performed for leak from duodenum identified on CT A/P Mod to poorly differentiated  adenocarcinoma, presumably gallbladder cancer-CT chest, MR liverperformed yesterday, suspect local mets.Palliative consult 1/27, plan for home with hospice, pt declining chemo/XRT or further surgery. Wound care - remove vac, wtd daily, JP drain teaching  Pain control - d/c PCA, transition to oral pain regimen ID - cont zosyn FEN -FLD,1/2TPN today, clamp g-tube,conttrickle j-tube feeds with no advancement DVT - SCDs,LMWH Dispo -4NP, encouraged ambulation/OOB to chair   Jesusita Oka, MD Trauma & General Surgery Please use [email or web address] to contact on call provider  08/28/2020  *Care during the described time interval was provided by me. I have reviewed this patient's available data, including medical history, events of note, physical examination and test results as part of my evaluation.

## 2020-08-28 NOTE — Progress Notes (Signed)
PHARMACY - TOTAL PARENTERAL NUTRITION CONSULT NOTE   Indication: expected Prolonged ileus  Patient Measurements: Height: 5\' 1"  (154.9 cm) Weight: 99.1 kg (218 lb 7.6 oz) IBW/kg (Calculated) : 47.8 TPN AdjBW (KG): 56 Body mass index is 41.28 kg/m.  Assessment: 73 yof presenting 1/21 with cholecystitis. 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 S/p laparotomy for washout, additional drain placement, G-tube, G-tube, pyloric exclusion with gastrojejunostomy on 1/23. Path resulted 1/24 with moderate to poorly differentiated adenocarcinoma. Pharmacy consulted to start TPN for expected prolonged ileus. Previously on CLD this admit, now NPO.  Glucose / Insulin: No hx DM (A1c 5.5%), CBGs controlled prior to TPN start. CBGs 210-296. 51 units SSI utilized in last 24hrs Electrolytes: all stable WNL Renal: AKI - SCr up to 2 (0.53 on 1/26), BUN up to 31 LFTs / TGs: LFTs / TG / Tbili wnl Prealbumin / albumin: Prealbumin <5; albumin 1.2 Intake / Output; MIVF: Drain output 2822ml/24hrs; UOP charted as 0 ml/kg/hr; LBM 1/27; MIVF: NS at 97ml/hr; net +3.1L this admit GI Imaging: 1/25 CT chest - no definitive evidence of metastatic disease to chest; bilateral pleural effusions 1/25 MRI liver - highly suggestive of tumor spread into adjacent liver along gallbladder fossa, suspicious for metastatic disease, R abdominal wall abscess, ?fistula from duodenum Surgeries / Procedures: none since TPN start  Central access: PICC placement pending 08/24/20 TPN start date: 08/24/20  Nutritional Goals (per RD recommendation on 08/24/20): Estimated - kCal: 1800-2000, Protein: 90-100g, Fluid: >/=1.8L  Goal TPN rate is 75 mL/hr (provides 90g of protein and 1829kcals per day)  Current Nutrition:  TPN Advanced to FLD on 1/28 Vital AF at 10 ml/hr  Plan:  Wean TPN to 1/2 goal rate 52mL/hr at 1800 per discussion with Dr. Bobbye Morton. *Per discussion with dietitian, patient ok for no  lipids for 7-10 days due to national shortage protocol (hold until at least 1/31). Clinisol utilized due to Producer, television/film/video. Electrolytes in TPN: continue same today - 44mEq/L of Na, 55mEq/L of K, 31mEq/L of Ca, 60mEq/L of Mg, and 15 mmol/L of Phos. Cl:Ac ratio 1:1 Add standard MVI and trace elements to TPN Add Levemir 7 units BID x 2 doses (anticipate will improve as TPN tapered, as CBGs were controlled off TPN, and patient with no hx DM) Continue resistant q4h SSI and adjust as needed Continue NS at 50 mL/hr per MD Monitor TPN labs F/u diet advancement/toleration and ability to wean TPN to off on 1/29   Arturo Morton, PharmD, BCPS Please check AMION for all [place] contact numbers Clinical Pharmacist 08/28/2020 7:43 AM

## 2020-08-28 NOTE — Progress Notes (Signed)
Daily Progress Note   Patient Name: Faith Morgan       Date: 08/28/2020 DOB: 1951/07/19  Age: 70 y.o. MRN#: [id number] Attending Physician: Faith Oka, MD Primary Care Physician: Faith Lass, MD Admit Date: 08/21/2020  Reason for Consultation/Follow-up: Establishing goals of care  Subjective: Patient awake and alert. In good spirits this morning. Tolerating liquid diet. Recently took prn oxycodone.   GOC:  Daughter Faith Morgan at bedside. Discussed plan of care inpatient and home with hospice discharge plan. Pending TOC referral. Answered questions. Daughter has PMT contact information.   Length of Stay: 5  Current Medications: Scheduled Meds:  . acetaminophen  1,000 mg Oral Q6H  . chlorhexidine  15 mL Mouth Rinse BID  . Chlorhexidine Gluconate Cloth  6 each Topical Daily  . enoxaparin (LOVENOX) injection  30 mg Subcutaneous Q24H  . feeding supplement (VITAL 1.5 CAL)  1,000 mL Per Tube Q24H  . insulin aspart  0-20 Units Subcutaneous Q4H  . insulin detemir  7 Units Subcutaneous BID  . mouth rinse  15 mL Mouth Rinse q12n4p  . methocarbamol  1,000 mg Oral Q8H  . pantoprazole (PROTONIX) IV  40 mg Intravenous Q12H  . sodium chloride flush  10-40 mL Intracatheter Q12H    Continuous Infusions: . sodium chloride 50 mL/hr at 08/28/20 0500  . piperacillin-tazobactam (ZOSYN)  IV 3.375 g (08/28/20 0515)  . TPN ADULT (ION) 75 mL/hr at 08/28/20 0500  . TPN ADULT (ION)      PRN Meds: bisacodyl, busPIRone, diphenhydrAMINE **OR** diphenhydrAMINE, metoprolol tartrate, naloxone **AND** sodium chloride flush, ondansetron (ZOFRAN) IV, oxyCODONE, sodium chloride flush, sodium chloride flush  Physical Exam Vitals and nursing note reviewed.  Constitutional:      General: She is awake.   HENT:     Head: Normocephalic and atraumatic.  Cardiovascular:     Rate and Rhythm: Normal rate.  Pulmonary:     Effort: No tachypnea, accessory muscle usage or respiratory distress.  Abdominal:     Comments: G/J tubes, JP, wound vac  Skin:    General: Skin is warm and dry.  Neurological:     Mental Status: She is alert and oriented to person, place, and time.  Psychiatric:        Mood and Affect: Mood normal.        Speech:  Speech normal.        Behavior: Behavior normal.        Cognition and Memory: Cognition normal.             Vital Signs: BP 132/63 (BP Location: Left Arm)   Pulse 98   Temp 98 F (36.7 C) (Oral)   Resp 19   Ht 5\' 1"  (1.549 m)   Wt 99.1 kg   SpO2 96%   BMI 41.28 kg/m  SpO2: SpO2: 96 % O2 Device: O2 Device: Nasal Cannula O2 Flow Rate: O2 Flow Rate (L/min): 2 L/min  Intake/output summary:   Intake/Output Summary (Last 24 hours) at 08/28/2020 1053 Last data filed at 08/28/2020 3810 Gross per 24 hour  Intake 3384.01 ml  Output 1995 ml  Net 1389.01 ml   LBM: Last BM Date: 08/27/20 Baseline Weight: Weight: 80.7 kg Most recent weight: Weight: 99.1 kg       Palliative Assessment/Data: PPS 40%    Flowsheet Rows   Flowsheet Row Most Recent Value  Intake Tab   Referral Department Trauma  Unit at Time of Referral Intermediate Care Unit  Palliative Care Primary Diagnosis Cancer  Palliative Care Type New Palliative care  Reason for referral Clarify Goals of Care  Date first seen by Palliative Care 08/27/20  Clinical Assessment   Palliative Performance Scale Score 40%  Psychosocial & Spiritual Assessment   Palliative Care Outcomes   Patient/Family meeting held? Yes  Who was at the meeting? patient and daughter  Palliative Care Outcomes Clarified goals of care, Counseled regarding hospice, Provided end of life care assistance, Provided advance care planning, Provided psychosocial or spiritual support, Changed CPR status, Completed durable DNR,  ACP counseling assistance, Transitioned to hospice      Patient Active Problem List   Diagnosis Date Noted  . Abdominal pain   . Adenocarcinoma ([place])   . Palliative care by specialist   . DNR (do not resuscitate)   . S/P cholecystectomy 08/21/2020  . Goals of care, counseling/discussion 08/21/2020    Palliative Care Assessment & Plan   Patient Profile: 70 y.o. female  with no significant past medical history admitted on 08/21/2020 with right upper quadrant pain with outpatient HIDA scan revealing cholecystitis. Admitted for lap chole by Dr. Bobbye Morgan. 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 Pathology revealed poorly differentiated adenocarcinoma with focal squamous metaplasia in gallbladder. CT and MRI completed. MRI highly suspicious for metastatic spread to liver, also ? Abscess. Oncology consulted and patient is very clear on her decision against chemotherapy or surgical intervention. She wishes to discharge home with hospice. Palliative medicine consultation for goals of care.   Assessment: S/p lap subtotal cholecystectomy for necrotic cholecystitis S/p exlap, GJ, g-tube, j-tube, JP Adenocarcinoma, presumably gallbladder cancer  Recommendations/Plan:  GOC discussion with patient and daughter 08/27/20.   Patient adamant on her decision against oncology workup/chemotherapy/surgery. She wishes to spend the remainder of her life home with family and support of hospice services. Quality of life is important to her. Daughter and husband respect her decision.  Copy of patient's living will/desire for natural death placed in paper chart for EMR.  MOST form completed. Patient's decisions include: DNR/DNI, comfort focused care and initiation of hospice on discharge, IVF/ABX for time trial, and short-term feeding tube (since patient  already with GJ tubes). Electronic [facility] MOST and durable DNR completed.   Continue current plan of care and  medical management inpatient per Dr. Bobbye Morgan. Working towards diet advancement and eventually discontinuation of TPN, tube feeds, and PCA if she tolerates orals.  Hospice will not accept with wound vac. Continue inpatient with plans to discontinue prior to discharge. Wound care per Dr. Craig Morgan recommendations.   TOC team consulted for home hospice services.   PMT provider will continue to follow and coordinate home with hospice.   Code Status: DNR/DNI   Code Status Orders  (From admission, onward)         Start     Ordered   08/27/20 1255  Do not attempt resuscitation (DNR)  Continuous       Question Answer Comment  In the event of cardiac or respiratory ARREST Do not call a "code blue"   In the event of cardiac or respiratory ARREST Do not perform Intubation, CPR, defibrillation or ACLS   In the event of cardiac or respiratory ARREST Use medication by any route, position, wound care, and other measures to relive pain and suffering. May use oxygen, suction and manual treatment of airway obstruction as needed for comfort.      08/27/20 1254        Code Status History    Date Active Date Inactive Code Status Order ID Comments User Context   08/21/2020 1259 08/27/2020 1254 Full Code [id number]  Faith Oka, MD Inpatient   Advance Care Planning Activity    Advance Directive Documentation   Flowsheet Row Most Recent Value  Type of Advance Directive Healthcare Power of Attorney, Living will  Pre-existing out of facility DNR order (yellow form or pink MOST form) -  "MOST" Form in Place? -       Prognosis:   Poor prognosis with suspicion for metastatic gallbladder cancer. Patient wishes to forgo aggressive oncology workup/chemotherapy/surgery.   Discharge Planning:  Home with Hospice  Care plan was discussed with RN, daughter, patient  Thank you for allowing the  Palliative Medicine Team to assist in the care of this patient.   Total Time 20 Prolonged Time Billed  no      Greater than 50%  of this time was spent counseling and coordinating care related to the above assessment and plan.  Ihor Dow, DNP, FNP-C Palliative Medicine Team  Phone: [phone] Fax: [phone]  Please contact Palliative Medicine Team phone at [phone] for questions and concerns.

## 2020-08-29 ENCOUNTER — Inpatient Hospital Stay (HOSPITAL_COMMUNITY): Payer: Medicare HMO

## 2020-08-29 LAB — COMPREHENSIVE METABOLIC PANEL
ALT: 25 U/L (ref 0–44)
AST: 31 U/L (ref 15–41)
Albumin: 1.1 g/dL — ABNORMAL LOW (ref 3.5–5.0)
Alkaline Phosphatase: 123 U/L (ref 38–126)
Anion gap: 10 (ref 5–15)
BUN: 38 mg/dL — ABNORMAL HIGH (ref 8–23)
CO2: 20 mmol/L — ABNORMAL LOW (ref 22–32)
Calcium: 7.4 mg/dL — ABNORMAL LOW (ref 8.9–10.3)
Chloride: 102 mmol/L (ref 98–111)
Creatinine, Ser: 2.48 mg/dL — ABNORMAL HIGH (ref 0.44–1.00)
GFR, Estimated: 20 mL/min — ABNORMAL LOW (ref >60)
Glucose, Bld: 122 mg/dL — ABNORMAL HIGH (ref 70–99)
Potassium: 4.5 mmol/L (ref 3.5–5.1)
Sodium: 132 mmol/L — ABNORMAL LOW (ref 135–145)
Total Bilirubin: 1.3 mg/dL — ABNORMAL HIGH (ref 0.3–1.2)
Total Protein: 4.9 g/dL — ABNORMAL LOW (ref 6.5–8.1)

## 2020-08-29 LAB — CBC
HCT: 27.4 % — ABNORMAL LOW (ref 36.0–46.0)
Hemoglobin: 8.8 g/dL — ABNORMAL LOW (ref 12.0–15.0)
MCH: 29.8 pg (ref 26.0–34.0)
MCHC: 32.1 g/dL (ref 30.0–36.0)
MCV: 92.9 fL (ref 80.0–100.0)
Platelets: 264 10*3/uL (ref 150–400)
RBC: 2.95 MIL/uL — ABNORMAL LOW (ref 3.87–5.11)
RDW: 13.7 % (ref 11.5–15.5)
WBC: 28.2 10*3/uL — ABNORMAL HIGH (ref 4.0–10.5)
nRBC: 0 % (ref 0.0–0.2)

## 2020-08-29 LAB — GLUCOSE, CAPILLARY
Glucose-Capillary: 105 mg/dL — ABNORMAL HIGH (ref 70–99)
Glucose-Capillary: 128 mg/dL — ABNORMAL HIGH (ref 70–99)
Glucose-Capillary: 137 mg/dL — ABNORMAL HIGH (ref 70–99)
Glucose-Capillary: 146 mg/dL — ABNORMAL HIGH (ref 70–99)
Glucose-Capillary: 147 mg/dL — ABNORMAL HIGH (ref 70–99)
Glucose-Capillary: 158 mg/dL — ABNORMAL HIGH (ref 70–99)

## 2020-08-29 LAB — SODIUM, URINE, RANDOM: Sodium, Ur: <10 mmol/L

## 2020-08-29 LAB — CREATININE, URINE, RANDOM: Creatinine, Urine: 90.25 mg/dL

## 2020-08-29 MED ORDER — NALOXONE HCL 0.4 MG/ML IJ SOLN: 0.4000 mg | INTRAMUSCULAR | Status: DC | PRN

## 2020-08-29 MED ORDER — LIDOCAINE HCL 1 % IJ SOLN
INTRAMUSCULAR | Status: AC
  Filled 2020-08-29: qty 20

## 2020-08-29 MED ORDER — DIPHENHYDRAMINE HCL 50 MG/ML IJ SOLN: 12.5000 mg | Freq: Four times a day (QID) | INTRAMUSCULAR | Status: DC | PRN

## 2020-08-29 MED ORDER — SODIUM CHLORIDE 0.9% FLUSH: 9.0000 mL | INTRAVENOUS | Status: DC | PRN

## 2020-08-29 MED ORDER — ONDANSETRON HCL 4 MG/2ML IJ SOLN: 4.0000 mg | Freq: Four times a day (QID) | INTRAMUSCULAR | Status: DC | PRN

## 2020-08-29 MED ORDER — DIPHENHYDRAMINE HCL 12.5 MG/5ML PO ELIX: 12.5000 mg | ORAL_SOLUTION | Freq: Four times a day (QID) | ORAL | Status: DC | PRN

## 2020-08-29 MED ORDER — MIDAZOLAM HCL 2 MG/2ML IJ SOLN
INTRAMUSCULAR | Status: AC
  Filled 2020-08-29: qty 4

## 2020-08-29 MED ORDER — MIDAZOLAM HCL 2 MG/2ML IJ SOLN
INTRAMUSCULAR | Status: AC | PRN
Start: 2020-08-29 — End: 2020-08-29
  Administered 2020-08-29 (×3): 0.5 mg via INTRAVENOUS

## 2020-08-29 MED ORDER — FREE WATER
200.0000 mL | Status: DC
  Administered 2020-08-30 – 2020-08-31 (×9): 200 mL

## 2020-08-29 MED ORDER — VITAL 1.5 CAL PO LIQD
1000.0000 mL | ORAL | Status: DC
  Administered 2020-08-29: 1000 mL
  Filled 2020-08-29: qty 1000

## 2020-08-29 MED ORDER — HYDROMORPHONE 1 MG/ML IV SOLN: INTRAVENOUS | Status: DC

## 2020-08-29 MED ORDER — FENTANYL CITRATE (PF) 100 MCG/2ML IJ SOLN
INTRAMUSCULAR | Status: AC | PRN
  Administered 2020-08-29 (×3): 25 ug via INTRAVENOUS

## 2020-08-29 MED ORDER — FENTANYL CITRATE (PF) 100 MCG/2ML IJ SOLN
INTRAMUSCULAR | Status: AC
  Filled 2020-08-29: qty 4

## 2020-08-29 MED ORDER — ALBUMIN HUMAN 25 % IV SOLN
25.0000 g | Freq: Once | INTRAVENOUS | Status: AC
  Administered 2020-08-30: 25 g via INTRAVENOUS
  Filled 2020-08-29: qty 100

## 2020-08-29 MED ORDER — VITAL 1.5 CAL PO LIQD
1000.0000 mL | ORAL | Status: DC
  Administered 2020-08-30 – 2020-08-31 (×3): 1000 mL
  Filled 2020-08-29 (×4): qty 1000

## 2020-08-29 NOTE — Progress Notes (Signed)
[facility])  Referral received for hospice services at home once discharged.  Spoke with Maudie Mercury, pts daughter, to explain services and offer support.  Plan is to d/c on Monday once all necessary DME is in place.  [facility] will order the following DME: hospital bed, O2, 3n1, walker, tube feeding pump--this will not be delivered until Sunday.  Please arrange for comfort meds at time of d/c so there is no lapse in her symptoms prior to hospice arriving in the home.  Venia Carbon RN, BSN, [facility] Liaison

## 2020-08-29 NOTE — H&P (Signed)
Chief Complaint: GB fossa and abdominal wall fluid collection  Referring Physician(s): [place]  Supervising Physician: Jacqulynn Cadet  Patient Status: [facility] - In-pt  History of Present Illness: Faith Morgan is a 70 y.o. female admitted on 08/21/2020 with right upper quadrant pain with outpatient HIDA scan revealing cholecystitis.  She underwent cholecystectomy by Dr. Bobbye Morton. On 08/21/20.  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 Bobbye Morton on 08/23/20.  Pathology revealed poorly differentiated adenocarcinoma with focal squamous metaplasia in gallbladder.   MRI highly suspicious for metastatic spread to liver.  Oncology was consulted and patient is very clear on her decision against chemotherapy or surgical intervention.   She wishes to discharge home with hospice and  Palliative medicine has been consulted.  CT done today due to ongoing abdominal pain and increasing WBC (up to 28.2) 1. Approximately 3.5 x 2.7 x 2 cm (volume = 10 cm^3) fluid and gas collection in the region of the gallbladder fossa. It is unclear if this represents residual gallbladder fossa abscess or the remnants of the previously partially resected gallbladder. 2. Persistent large complex fluid collection within the right anterolateral abdominal wall musculature measuring approximately 12.3 x 2.8 x 6.7 cm (volume = 120 cm^3). The mixed attenuation suggests either internal hemorrhage or a small amount of residual oral contrast material which was noted on the prior CT scan. The more superior surgical drain does pass through the superior aspect of this collection before it enters the abdomen.  We are asked to evaluate for drain placement.   Past Medical History:  Diagnosis Date  . Eczema   . Liver cyst 07/2020   inconclusive  via ultrasound    Past Surgical History:  Procedure Laterality Date  . CHOLECYSTECTOMY N/A 08/21/2020   Procedure: LAPAROSCOPIC CHOLECYSTECTOMY, SUBTOTAL;  Surgeon: Jesusita Oka, MD;  Location: [facility];  Service: General;  Laterality: N/A;  . COLONOSCOPY    . EYE SURGERY Bilateral 03/2020   cataract removal  . GASTROJEJUNOSTOMY N/A 08/23/2020   Procedure: PYLORIC EXCLUSION; GASTROJEJUNOSTOMY;  Surgeon: Clovis Riley, MD;  Location: [facility];  Service: General;  Laterality: N/A;  . GASTROSTOMY Left 08/23/2020   Procedure: INSERTION OF GASTROSTOMY TUBE;  Surgeon: Clovis Riley, MD;  Location: [facility];  Service: General;  Laterality: Left;  . JEJUNOSTOMY  08/23/2020   Procedure: Shanon Rosser;  Surgeon: Clovis Riley, MD;  Location: [facility];  Service: General;;  . LAPAROTOMY N/A 08/23/2020   Procedure: EXPLORATORY LAPAROTOMY; OMENTAL PEDICLE FLAP;  Surgeon: Clovis Riley, MD;  Location: [facility];  Service: General;  Laterality: N/A;    Allergies: Crestor [rosuvastatin]  Medications: Prior to Admission medications   Medication Sig Start Date End Date Taking? Authorizing Provider  Cholecalciferol (DIALYVITE VITAMIN D 5000) 125 MCG (5000 UT) capsule Take 5,000 Units by mouth daily.   Yes [provider]  Multiple Vitamin (MULTIVITAMIN WITH MINERALS) TABS tablet Take 1 tablet by mouth daily.   Yes [provider]     History reviewed. No pertinent family history.  Social History   Socioeconomic History  . Marital status: Married    Spouse name: Not on file  . Number of children: Not on file  . Years of education: Not on file  . Highest education level: Not on file  Occupational History  . Not on file  Tobacco Use  . Smoking status:  Former Smoker  . Smokeless tobacco: Never Used  Vaping Use  . Vaping Use: Never used  Substance and Sexual Activity  . Alcohol use: Never  . Drug use: Never  . Sexual activity: Not on file  Other Topics Concern  . Not on file   Social History Narrative  . Not on file   Social Determinants of Health   Financial Resource Strain: Not on file  Food Insecurity: Not on file  Transportation Needs: Not on file  Physical Activity: Not on file  Stress: Not on file  Social Connections: Not on file     Review of Systems: A 12 point ROS discussed and pertinent positives are indicated in the HPI above.  All other systems are negative.  Review of Systems  Vital Signs: BP 131/61 (BP Location: Left Arm)   Pulse 99   Temp 98 F (36.7 C) (Oral)   Resp 18   Ht 5\' 1"  (1.549 m)   Wt 99.1 kg   SpO2 96%   BMI 41.28 kg/m   Physical Exam Vitals reviewed.  Constitutional:      Appearance: She is obese.  HENT:     Head: Normocephalic and atraumatic.  Eyes:     Extraocular Movements: Extraocular movements intact.  Cardiovascular:     Rate and Rhythm: Normal rate and regular rhythm.  Pulmonary:     Effort: Pulmonary effort is normal. No respiratory distress.     Breath sounds: Normal breath sounds.  Abdominal:     Tenderness: There is abdominal tenderness.     Comments: Incision/drain s/p open cholecystectomy  Musculoskeletal:        General: Normal range of motion.  Skin:    General: Skin is warm and dry.  Neurological:     General: No focal deficit present.     Mental Status: She is alert and oriented to person, place, and time.  Psychiatric:        Mood and Affect: Mood normal.        Behavior: Behavior normal.        Thought Content: Thought content normal.        Judgment: Judgment normal.    Imaging: CT ABDOMEN PELVIS WO CONTRAST  Result Date: 08/29/2020 CLINICAL DATA:  70 year old female with abdominal pain and fever status post subtotal cholecystectomy, diverting gastrojejunostomy and duodenal repair for necrotic cholecystitis and duodenal leak. Percutaneous surgical drains, gastrostomy and direct jejunostomy tubes are present. EXAM: CT ABDOMEN AND PELVIS WITHOUT CONTRAST TECHNIQUE: Multidetector  CT imaging of the abdomen and pelvis was performed following the standard protocol without IV contrast. COMPARISON:  MR abdomen 08/25/2020; CT abdomen/pelvis 08/23/2020 FINDINGS: Lower chest: Similar degree of small right pleural effusion and associated right basilar atelectasis. Chronic bilateral lower lobe bronchial wall thickening. Subsegmental atelectasis also present in the left lower lobe. No acute abnormality. Hepatobiliary: Improving fluid and gas collection in the region the gallbladder fossa. It is unclear if this represents the residual gallbladder versus a postoperative collection. The collection measures 3.5 x 2.7 cm. Significantly improved perihepatic fluid collection. The previously placed drainage catheter has changed in position. No longer extends up over the liver into the subdiaphragmatic space but now rather rests in the sub hepatic space. Pancreas: Unremarkable. No pancreatic ductal dilatation or surrounding inflammatory changes. Spleen: Normal in size without focal abnormality. Adrenals/Urinary Tract: Adrenal glands are unremarkable. Mild hydronephrosis. The bladder is distended with urine. Stomach/Bowel: Surgical changes of pyloric exclusion and retro colic retro gastric gastro jejunostomy. A percutaneous gastrostomy  tube has been placed. The gastrostomy tube appears well position. No inflammatory changes along the tube tract. There is a direct percutaneous jejunostomy tube as well. This also appears intact and unremarkable. Additional surgical changes are present in the region of the duodenum likely consistent with omental pedicle flap wrapping. Significantly decreased intra-abdominal air. Small loculation present inferior to hepatic segment 4B. Vascular/Lymphatic: Limited evaluation in the absence of intravenous contrast. No suspicious lymphadenopathy. Reproductive: Uterus and bilateral adnexa are unremarkable. Other: Persistent fluid collection within the right lateral abdominal wall  musculature measuring approximately 12.3 x 2.8 x 6.7 cm. Fluid collection is mixed in attenuation with areas of high attenuation which may represent residual oral contrast material versus hemorrhage. The more superior percutaneous surgical drain does pass through the superior aspect of the collection. There are extensive surrounding inflammatory changes as well as subcutaneous emphysema within the subcutaneous fat of the right lower quadrant abdominal wall. A second surgical drain is present in the right lower quadrant. This terminates in the anatomic pelvis. There is no fluid collection near this drain. Musculoskeletal: No acute fracture or aggressive appearing lytic or blastic osseous lesion. Open midline incision. Anasarca. Multilevel degenerative disc disease. IMPRESSION: 1. Approximately 3.5 x 2.7 x 2 cm (volume = 10 cm^3) fluid and gas collection in the region of the gallbladder fossa. It is unclear if this represents residual gallbladder fossa abscess or the remnants of the previously partially resected gallbladder. 2. Persistent large complex fluid collection within the right anterolateral abdominal wall musculature measuring approximately 12.3 x 2.8 x 6.7 cm (volume = 120 cm^3). The mixed attenuation suggests either internal hemorrhage or a small amount of residual oral contrast material which was noted on the prior CT scan. The more superior surgical drain does pass through the superior aspect of this collection before it enters the abdomen. 3. Near-total interval resolution of intraperitoneal air with only a small focal loculation in the subhepatic space. 4. The more inferior surgical drainage catheter does not passed through any drainable fluid collection and may no longer be needed. 5. Well-positioned percutaneous gastrostomy and direct jejunostomy tubes without evidence of complication. 6. Mild bilateral hydronephrosis and marked distention of the bladder suggests urinary retention. 7. Interval  development of anasarca which may reflect volume overload, CHF or third spacing. 8. Surgical changes of pyloric exclusion with retro colic/retrogastric gastrojejunostomy and omental wrap repair of duodenal perforation with open midline incision. 9. Stable small right pleural effusion and bibasilar atelectasis. Electronically Signed   By: Jacqulynn Cadet M.D.   On: 08/29/2020 10:39   CT CHEST W CONTRAST  Result Date: 08/25/2020 CLINICAL DATA:  70 year old female with history of gastrointestinal cancer. Staging examination. EXAM: CT CHEST WITH CONTRAST TECHNIQUE: Multidetector CT imaging of the chest was performed during intravenous contrast administration. CONTRAST:  55mL OMNIPAQUE IOHEXOL 350 MG/ML SOLN COMPARISON:  No prior chest CT. CT the abdomen and pelvis 08/23/2020. FINDINGS: Cardiovascular: Heart size is normal. There is no significant pericardial fluid, thickening or pericardial calcification. Right upper extremity PICC with tip terminating in the distal superior vena cava. Mediastinum/Nodes: No pathologically enlarged mediastinal or hilar lymph nodes. Some small densely calcified right hilar lymph nodes are incidentally noted. Esophagus is unremarkable in appearance. No axillary lymphadenopathy. Lungs/Pleura: Small right and trace left pleural effusions lying dependently. Extensive passive atelectasis in the lower lobes of the lungs bilaterally. No definite consolidative airspace disease. No suspicious appearing pulmonary nodules or masses are noted. Calcified granuloma in the periphery of the right upper lobe. Upper Abdomen:  Irregular area of low attenuation in the central aspect of the liver incompletely imaged (see report from recent CT the abdomen and pelvis 08/23/2020 for full description of findings beneath the diaphragm). Musculoskeletal: Small amount of gas in the inferior aspect of the right chest wall, similar to recent CT the abdomen and pelvis. There are no aggressive appearing lytic or  blastic lesions noted in the visualized portions of the skeleton. IMPRESSION: 1. No definitive evidence of metastatic disease to the chest. 2. However, there are bilateral pleural effusions (small on the right and trace on the left). Attention on follow-up studies is recommended to ensure resolution. The possibility of malignant pleural effusions is not entirely excluded, but not strongly favored on the basis of today's examination. 3. Passive atelectasis in the lower lobes of the lungs bilaterally. 4. Additional incidental findings, as above. Electronically Signed   By: Vinnie Langton M.D.   On: 08/25/2020 09:18   CT ABDOMEN PELVIS W CONTRAST  Result Date: 08/23/2020 CLINICAL DATA:  Status post subtotal cholecystectomy with drain placement for acute cholecystitis on 08/21/2020. Bilious drainage from right-sided surgical drain. EXAM: CT ABDOMEN AND PELVIS WITH CONTRAST TECHNIQUE: Multidetector CT imaging of the abdomen and pelvis was performed using the standard protocol following bolus administration of intravenous contrast. CONTRAST:  161mL OMNIPAQUE IOHEXOL 300 MG/ML  SOLN COMPARISON:  No prior CT studies for comparison. FINDINGS: Lower chest: Small right pleural effusion.  Bibasilar atelectasis. Hepatobiliary: There is air in the gallbladder fossa postoperatively with a tract of air that can be followed to the duodenum suggestive of a fistula with potential focal duodenal ulceration. This potential focal fistula is at the level of the proximal duodenum and appears to be clearly proximal to the ampulla. There is surrounding inflammation and edema within the liver abutting the gallbladder fossa with suspected early pericholecystic abscess formation in regions containing a small amount of air. No discrete liquefied hepatic abscess identified. There are some satellite areas of edema within the adjacent liver. Overall area of hepatic inflammation abutting the gallbladder fossa measures up to 4.5 cm in gross  diameter. There is free air abutting the liver as well as some free fluid lateral to the liver which is of high density, consistent with extravasated oral contrast which is largely present in the stomach and proximal duodenum. Very little oral contrast is present in the proximal jejunum. This would also support potential perforation of the duodenum. Pancreas: Unremarkable. No pancreatic ductal dilatation or surrounding inflammatory changes. Spleen: Normal in size without focal abnormality. Adrenals/Urinary Tract: Adrenal glands are unremarkable. Kidneys are normal, without renal calculi, focal lesion, or hydronephrosis. Bladder is unremarkable. Stomach/Bowel: No overt bowel obstruction. Pockets of free intraperitoneal air are present throughout the peritoneal cavity and primarily in the upper abdomen in a perihepatic and epigastric location. There also is free air anteriorly in the mid abdomen. Vascular/Lymphatic: No significant vascular findings are present. No enlarged abdominal or pelvic lymph nodes. Reproductive: Uterus and bilateral adnexa are unremarkable. Other: There also is some free air tracking into the right abdominal wall and the subcutaneous fat of the right abdomen with associated surrounding inflammation and pooled high density fluid immediately external to the abdominal wall and adjacent to the course of the surgical drain consistent with oral contrast exiting the drain and leaking into the soft tissues. The surgical drain enters the right peritoneal cavity and ascends lateral to the liver and up to the infradiaphragmatic space above the dome of the liver. Musculoskeletal: No acute or significant osseous  findings. IMPRESSION: 1. Air in the gallbladder fossa postoperatively with a tract of air that can be followed to the duodenum suggestive of a fistula with potential focal duodenal ulceration. This potential focal fistula is at the level of the proximal duodenum and is clearly proximal to the  ampulla. There is surrounding inflammation and edema within the liver abutting the gallbladder fossa with potential early pericholecystic abscess formation in regions containing a small amount of air. No discrete liquefied hepatic abscess identified. 2. Pockets of free intraperitoneal air throughout the peritoneal cavity and primarily in the upper abdomen in a perihepatic and epigastric location. There is also some free fluid lateral to the liver which is of high density and likely consistent with extravasated oral contrast which is largely present in the stomach and proximal duodenum. The surgical drain enters the right peritoneal cavity and ascends lateral to the liver and up to the infra diaphragmatic space above the dome of the liver. There also is extravasated oral contrast in the subcutaneous fat immediately superficial to the abdominal wall musculature and along the course of the surgical drain. Air is also present in the right abdominal wall and right abdominal subcutaneous fat. 3. Small right pleural effusion. 4. These results were called by telephone at the time of interpretation on 08/23/2020 at 12:06 PM to Dr. Jens Som, who verbally acknowledged these results. Electronically Signed   By: Aletta Edouard M.D.   On: 08/23/2020 12:12   MR LIVER W WO CONTRAST  Result Date: 08/25/2020 CLINICAL DATA:  Adenocarcinoma of the gallbladder. Status post cholecystectomy. Staging. EXAM: MRI ABDOMEN WITHOUT AND WITH CONTRAST TECHNIQUE: Multiplanar multisequence MR imaging of the abdomen was performed both before and after the administration of intravenous contrast. CONTRAST:  39mL GADAVIST GADOBUTROL 1 MMOL/ML IV SOLN COMPARISON:  CT chest 08/25/2020.  Abdomen/pelvis CT 08/23/2020. FINDINGS: Motion degraded exam. Lower chest: Small bilateral pleural effusions with bibasilar collapse/consolidation in the lower lobes. Hepatobiliary: Markedly heterogeneous liver perfusion. Irregular rim enhancing lesion seen along  the cranial aspect of the gallbladder fossa measuring approximately 5.2 x 4.5 cm. This appears centrally necrotic and diffusion imaging shows restricted diffusion in the area of irregular rim enhancement. Just anterior to this, along the falciform ligament is a 2.1 cm enhancing lesion in segment IV. This T2 hyperintense lesion is well visualized on axial T2 image 17 of series 5 and restricts diffusion, highly suggestive of metastatic involvement. 9 mm T2 intermediate hyperintensity lesion in the caudate lobe just anteromedial to the IVC (T2 image 12/5) restricts diffusion and is suspicious for metastatic involvement. This lesion appears to have central necrosis with peripheral rim enhancement on postcontrast imaging. Dilatation of bile ducts in the left and right liver evident with the site of obstruction noted at the confluence which is contiguous with the abnormal enhancing tissue along the gallbladder fossa. This tissue markedly attenuates the duct just distal to the confluence where it measures only 1-2 mm in diameter (see axial T2 image 17 of series 5). Coronal SS Common bile duct in the head of the pancreas is nondilated. FSE image 21/3 shows the irregular soft tissue involving the left and right hepatic ducts and common duct distal to the confluence. Pancreas: Multiple cystic foci are identified in the tip of the pancreatic tail, not well evaluated on postcontrast imaging due to motion degradation but no substantial soft tissue component evident. Spleen:  No splenomegaly. No focal mass lesion. Adrenals/Urinary Tract: No adrenal nodule or mass. Central sinus cysts noted in the kidneys bilaterally.  Stomach/Bowel: Gastrostomy tube noted in the stomach which is decompressed. No small bowel distention within the visualized abdomen. Mild gaseous distention of colon evident. Vascular/Lymphatic: No abdominal aortic aneurysm. Portal vein and superior mesenteric vein are patent. Splenic vein difficult to visualized due  to motion degradation but does appear patent. 10 mm short axis lymph node identified in the hepatoduodenal ligament. Other:  No substantial intraperitoneal free fluid. Musculoskeletal: Extensive edema and inflammation noted in the right abdominal wall including a rim enhancing collection in the right lateral abdominal wall musculature similar to CT scan from 2 days ago. Right abdominal drain is not well demonstrated by MRI. No focal suspicious marrow enhancement within the visualized bony anatomy. IMPRESSION: 1. 5.2 x 4.5 cm irregular rim enhancing lesion along the cranial aspect of the gallbladder fossa shows restricted diffusion and is highly suggestive of tumor spread into the adjacent liver along the gallbladder fossa given the history of adenocarcinoma. Abscess is considered less likely but not entirely excluded. 2.1 cm enhancing lesion in segment IV and 9 mm lesion in the caudate lobe are compatible with metastatic involvement. 2. Dilatation of the bile ducts in the left and right liver secondary to involvement by the lesion along the gallbladder fossa where the ducts become obliterated just proximal to and at the level of the hepatic duct confluence. This tissue markedly attenuates the intrahepatic segment of the common duct just distal to the confluence. No dilatation of the common bile duct noted in the head of the pancreas. 3. 10 mm short axis lymph node in the hepatoduodenal ligament, indeterminate but suspicious for metastatic disease. Attention on follow-up recommended. 4. Extensive edema and inflammation in the right abdominal wall including a rim enhancing collection in the right lateral abdominal wall musculature compatible with abscess. 5. Previous abdomen/pelvis CT raised the question of fistula from the duodenum. Gas is not well evaluated on MRI and given the substantial motion degradation of today's study, assessment for fistula is limited. 6. Complex cystic lesion in the tail of pancreas without  substantial enhancing component. Likely benign. Close attention on follow-up recommended. 7. Small bilateral pleural effusions with bibasilar collapse/consolidation in the lower lobes. Electronically Signed   By: Misty Stanley M.D.   On: 08/25/2020 12:08   NM Hepato W/EF  Result Date: 08/10/2020 CLINICAL DATA:  History of gallstones. Right upper quadrant pain which radiates to the right posterior abdominal wall EXAM: NUCLEAR MEDICINE HEPATOBILIARY IMAGING TECHNIQUE: Sequential images of the abdomen were obtained out to 60 minutes following intravenous administration of radiopharmaceutical. RADIOPHARMACEUTICALS:  5.2 mCi Tc-27m  Choletec IV COMPARISON:  None. FINDINGS: Prompt uptake and biliary excretion of activity by the liver is seen. Biliary activity passes into small bowel, consistent with patent common bile duct. Despite imaging for 2 hours and 30 minutes no gallbladder activity visualized. IMPRESSION: 1. Nonvisualization of the gallbladder compatible with cystic duct obstruction due to cholecystitis. 2. These results will be called to the ordering clinician or representative by the Radiologist Assistant, and communication documented in the PACS or [employer]. Electronically Signed   By: Kerby Moors M.D.   On: 08/10/2020 11:06   DG Chest Port 1 View  Result Date: 08/26/2020 CLINICAL DATA:  Respiratory failure EXAM: PORTABLE CHEST 1 VIEW COMPARISON:  03/26/2012 chest radiograph. FINDINGS: Right PICC terminates in the lower third of the SVC. Stable cardiomediastinal silhouette with normal heart size. No pneumothorax. Probable small bilateral pleural effusions, right greater than left. Low lung volumes with mild bibasilar atelectasis. No pulmonary edema. IMPRESSION:  Low lung volumes with mild bibasilar atelectasis. Probable small bilateral pleural effusions, right greater than left. Electronically Signed   By: Ilona Sorrel M.D.   On: 08/26/2020 14:36   VAS [place] LOWER EXTREMITY VENOUS  (DVT)  Result Date: 08/27/2020  Lower Venous DVT Study Other Indications: Tachycardia. Risk Factors: None identified. Comparison Study: No previous exams   Examination Guidelines: A complete evaluation includes B-mode imaging, spectral Doppler, color Doppler, and power Doppler as needed of all accessible portions of each vessel. Bilateral testing is considered an integral part of a complete examination. Limited examinations for reoccurring indications may be performed as noted. The reflux portion of the exam is performed with the patient in reverse Trendelenburg.  +---------+---------------+---------+-----------+----------+--------------+ RIGHT    CompressibilityPhasicitySpontaneityPropertiesThrombus Aging +---------+---------------+---------+-----------+----------+--------------+ CFV      Full           Yes      Yes                                 +---------+---------------+---------+-----------+----------+--------------+ SFJ      Full                                                        +---------+---------------+---------+-----------+----------+--------------+ FV Prox  Full                                                        +---------+---------------+---------+-----------+----------+--------------+ FV Mid   Full                                                        +---------+---------------+---------+-----------+----------+--------------+ FV DistalFull                                                        +---------+---------------+---------+-----------+----------+--------------+ PFV      Full                                                        +---------+---------------+---------+-----------+----------+--------------+ POP      Full           Yes      Yes                                 +---------+---------------+---------+-----------+----------+--------------+ PTV      Full                                                         +---------+---------------+---------+-----------+----------+--------------+  PERO     Full                                                        +---------+---------------+---------+-----------+----------+--------------+   +---------+---------------+---------+-----------+----------+--------------+ LEFT     CompressibilityPhasicitySpontaneityPropertiesThrombus Aging +---------+---------------+---------+-----------+----------+--------------+ CFV      Full           Yes      Yes                                 +---------+---------------+---------+-----------+----------+--------------+ SFJ      Full                                                        +---------+---------------+---------+-----------+----------+--------------+ FV Prox  Full                                                        +---------+---------------+---------+-----------+----------+--------------+ FV Mid   Full                                                        +---------+---------------+---------+-----------+----------+--------------+ FV DistalFull                                                        +---------+---------------+---------+-----------+----------+--------------+ PFV      Full                                                        +---------+---------------+---------+-----------+----------+--------------+ POP      Full           Yes      Yes                                 +---------+---------------+---------+-----------+----------+--------------+ PTV      Full                                                        +---------+---------------+---------+-----------+----------+--------------+ PERO     Full                                                        +---------+---------------+---------+-----------+----------+--------------+  Summary: BILATERAL: - No evidence of deep vein thrombosis seen in the lower extremities, bilaterally. -No evidence of  popliteal cyst, bilaterally.   *See table(s) above for measurements and observations. Electronically signed by Servando Snare MD on 08/27/2020 at 1:58:57 PM.    Final    [place] EKG SITE RITE  Result Date: 08/24/2020 If Site Rite image not attached, placement could not be confirmed due to current cardiac rhythm.   Labs:  CBC: Recent Labs    08/26/20 0500 08/27/20 0419 08/28/20 0500 08/29/20 0415  WBC 8.9 13.6* 18.3* 28.2*  HGB 11.0* 11.2* 9.6* 8.8*  HCT 32.7* 35.2* 31.1* 27.4*  PLT 241 233 262 264    COAGS: No results for input(s): INR, APTT in the last 8760 hours.  BMP: Recent Labs    08/27/20 0419 08/28/20 0500 08/28/20 2125 08/29/20 0858  NA 134* 135 130* 132*  K 4.3 4.2 4.1 4.5  CL 102 103 99 102  CO2 23 22 20* 20*  GLUCOSE 211* 210* 190* 122*  BUN 21 31* 36* 38*  CALCIUM 6.8* 7.1* 7.0* 7.4*  CREATININE 1.40* 2.00* 2.37* 2.48*  GFRNONAA 40* 26* 22* 20*    LIVER FUNCTION TESTS: Recent Labs    08/26/20 0500 08/27/20 0419 08/28/20 0500 08/29/20 0858  BILITOT 1.2 1.0 1.0 1.3*  AST 32 34 30 31  ALT 24 30 28 25   ALKPHOS 89 90 102 123  PROT 4.2* 4.3* 4.6* 4.9*  ALBUMIN 1.4* 1.2* 1.2* 1.1*    TUMOR MARKERS: No results for input(s): AFPTM, CEA, CA199, CHROMGRNA in the last 8760 hours.  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 Bobbye Morton on 08/23/20.  Approximately 3.5 x 2.7 x 2 cm (volume = 10 cm^3) fluid and gas collection in the region of the gallbladder fossa  Persistent large complex fluid collection within the right anterolateral abdominal wall musculature measuring approximately 12.3 x 2.8 x 6.7 cm (volume = 120 cm^3).   Will proceed with image guided placement of drains today by Dr. Laurence Ferrari.  Risks and benefits discussed with the patient including bleeding, infection, damage to  adjacent structures, bowel perforation/fistula connection, and sepsis.  All of the patient's questions were answered, patient is agreeable to proceed. Consent signed and in chart.  Thank you for this interesting consult.  I greatly enjoyed meeting Faith Morgan and look forward to participating in their care.  A copy of this report was sent to the requesting provider on this date.  Electronically Signed: Murrell Redden, PA-C   08/29/2020, 11:18 AM      I spent a total of 40 Minutes  in face to face in clinical consultation, greater than 50% of which was counseling/coordinating care for image guided placement of drains.

## 2020-08-29 NOTE — Procedures (Signed)
Interventional Radiology Procedure Note  Procedure:  1.) Placement of 61F drain into RUQ perihepatic fluid collection 2.) Placement of 61F drain into GB fossa collection 3.) Placement of 65F drain into right abdominal wall collection  Aspiration yields copious bilious fluid from all 3 drains, > 500 mL in total.   Complications: None  Estimated Blood Loss: None  Recommendations: - All drains to JP bulb suction    Signed,  Criselda Peaches, MD

## 2020-08-29 NOTE — Progress Notes (Signed)
Patient off the unit for CT.

## 2020-08-29 NOTE — Progress Notes (Signed)
CSW spoke with Anderson Malta at [facility] to make referral for home hospice services.  Madilyn Fireman, MSW, LCSW-A Transitions of Care  Clinical Social Worker I [phone]

## 2020-08-29 NOTE — Progress Notes (Signed)
Trauma/Critical Care Follow Up Note  Subjective:    Overnight Issues:   Objective:  Vital signs for last 24 hours: Temp:  [97.7 F (36.5 C)-98.6 F (37 C)] 98 F (36.7 C) (01/29 0756) Pulse Rate:  [95-121] 99 (01/29 0756) Resp:  [17-28] 18 (01/29 0756) BP: (113-138)/(51-75) 131/61 (01/29 0756) SpO2:  [94 %-97 %] 96 % (01/29 0756) FiO2 (%):  [0 %] 0 % (01/29 0413)  Hemodynamic parameters for last 24 hours:    Intake/Output from previous day: 01/28 0701 - 01/29 0700 In: 10 [I.V.:10] Out: 900 [Drains:900]  Intake/Output this shift: No intake/output data recorded.  Vent settings for last 24 hours: FiO2 (%):  [0 %] 0 %  Physical Exam:  Gen: comfortable, no distress Neuro: non-focal exam HEENT: PERRL Neck: supple CV: RRR Pulm: unlabored breathing Abd: soft, mild erythema and fullness of the R abdomen, JPx2, SS (60cc) and bilious (draining around JP, not into), midline dressed wtd, g-tube clamped, j-tube with trickle feeds GU: clear yellow urine Extr: wwp, 1+ edema   Results for orders placed or performed during the hospital encounter of 08/21/20 (from the past 24 hour(s))  Glucose, capillary     Status: Abnormal   Collection Time: 08/28/20  8:47 AM  Result Value Ref Range   Glucose-Capillary 222 (H) 70 - 99 mg/dL  Glucose, capillary     Status: Abnormal   Collection Time: 08/28/20 12:26 PM  Result Value Ref Range   Glucose-Capillary 276 (H) 70 - 99 mg/dL  Glucose, capillary     Status: Abnormal   Collection Time: 08/28/20  4:38 PM  Result Value Ref Range   Glucose-Capillary 293 (H) 70 - 99 mg/dL  Glucose, capillary     Status: Abnormal   Collection Time: 08/28/20  8:04 PM  Result Value Ref Range   Glucose-Capillary 199 (H) 70 - 99 mg/dL   Comment 1 Notify RN   Basic metabolic panel     Status: Abnormal   Collection Time: 08/28/20  9:25 PM  Result Value Ref Range   Sodium 130 (L) 135 - 145 mmol/L   Potassium 4.1 3.5 - 5.1 mmol/L   Chloride 99 98 - 111  mmol/L   CO2 20 (L) 22 - 32 mmol/L   Glucose, Bld 190 (H) 70 - 99 mg/dL   BUN 36 (H) 8 - 23 mg/dL   Creatinine, Ser 2.37 (H) 0.44 - 1.00 mg/dL   Calcium 7.0 (L) 8.9 - 10.3 mg/dL   GFR, Estimated 22 (L) >60 mL/min   Anion gap 11 5 - 15  Glucose, capillary     Status: Abnormal   Collection Time: 08/28/20 11:22 PM  Result Value Ref Range   Glucose-Capillary 153 (H) 70 - 99 mg/dL  Glucose, capillary     Status: Abnormal   Collection Time: 08/29/20  3:27 AM  Result Value Ref Range   Glucose-Capillary 158 (H) 70 - 99 mg/dL  CBC     Status: Abnormal   Collection Time: 08/29/20  4:15 AM  Result Value Ref Range   WBC 28.2 (H) 4.0 - 10.5 K/uL   RBC 2.95 (L) 3.87 - 5.11 MIL/uL   Hemoglobin 8.8 (L) 12.0 - 15.0 g/dL   HCT 27.4 (L) 36.0 - 46.0 %   MCV 92.9 80.0 - 100.0 fL   MCH 29.8 26.0 - 34.0 pg   MCHC 32.1 30.0 - 36.0 g/dL   RDW 13.7 11.5 - 15.5 %   Platelets 264 150 - 400 K/uL   nRBC  0.0 0.0 - 0.2 %  Glucose, capillary     Status: Abnormal   Collection Time: 08/29/20  7:54 AM  Result Value Ref Range   Glucose-Capillary 128 (H) 70 - 99 mg/dL    Assessment & Plan: The plan of care was discussed with the bedside nurse for the day, [name], who is in agreement with this plan and no additional concerns were raised.   Present on Admission: **None**    LOS: 6 days   Additional comments:I reviewed the patient's new clinical lab test results.   and I reviewed the patients new imaging test results.    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 Onc has signed off. Abdominal wall fullness - plan for CT A/P, will do without contrast in the  setting of AKI AKI - has been gently hydrated, nephrotoxic agents d/c'd, will send urine studies today and get renal consult Wound care - wtd daily, JP drain teaching  Pain control - PCA, transition to oral pain regimen prior to discharge ID - cont zosyn FEN -FLD,1/2TPN to continue, clamp g-tube,incr j-tube feeds to 20/h DVT - SCDs,LMWH Dispo -4NP, encouraged ambulation/OOB to chair  Jesusita Oka, MD Trauma & General Surgery Please use [email or web address] to contact on call provider  08/29/2020  *Care during the described time interval was provided by me. I have reviewed this patient's available data, including medical history, events of note, physical examination and test results as part of my evaluation.

## 2020-08-29 NOTE — Progress Notes (Signed)
PHARMACY - TOTAL PARENTERAL NUTRITION CONSULT NOTE   Indication: expected Prolonged ileus  Patient Measurements: Height: 5\' 1"  (154.9 cm) Weight: 99.1 kg (218 lb 7.6 oz) IBW/kg (Calculated) : 47.8 TPN AdjBW (KG): 56 Body mass index is 41.28 kg/m.  Assessment: 26 yof presenting 1/21 with cholecystitis. 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 S/p laparotomy for washout, additional drain placement, G-tube, G-tube, pyloric exclusion with gastrojejunostomy on 1/23. Path resulted 1/24 with moderate to poorly differentiated adenocarcinoma. Pharmacy consulted to start TPN for expected prolonged ileus. Previously on CLD this admit, now NPO.  Glucose / Insulin: No hx DM (A1c 5.5%), CBGs controlled prior to TPN start. CBGs 153-293. 41 units SSI utilized in last 24hrs + Levemir 7 units x2 doses. Improving w/ reduction in TPN (CBGs <200 since reduced rate).  Electrolytes: Na trending down 130. CO2 low at 20.  Renal: AKI - SCr up to 2.37 (0.53 on 1/26), BUN up to 36 LFTs / TGs: LFTs / TG / Tbili wnl Prealbumin / albumin: Prealbumin <5; albumin 1.2 Intake / Output; MIVF: Drain output 924ml/24hrs; UOP charted as 0.1 ml/kg/hr; LBM 1/27; MIVF: NS at 61ml/hr; net +2.2L this admit GI Imaging: 1/25 CT chest - no definitive evidence of metastatic disease to chest; bilateral pleural effusions 1/25 MRI liver - highly suggestive of tumor spread into adjacent liver along gallbladder fossa, suspicious for metastatic disease, R abdominal wall abscess, ?fistula from duodenum Surgeries / Procedures: none since TPN start  Central access: PICC placement pending 08/24/20 TPN start date: 08/24/20  Nutritional Goals (per RD recommendation on 08/28/20): Estimated - kCal: 1800-2100, Protein: 90-110g, Fluid: >/=1.8L  Goal TPN rate is 75 mL/hr (provides 90g of protein and 1829kcals per day)  Current Nutrition:  TPN Advanced to FLD on 1/28 (poor to fair intake/% not documented Vital  AF at 10>>20 ml/hr (~40% of goal) + Ensure Enlive TID (1 documented)  Plan:  Continue TPN at 1/2 goal rate 7mL/hr until 1800 PM then stop per discussion with Dr. Bobbye Morton. Tube feeds to advance to goal today.  Continue resistant q4h SSI and adjust as needed (no further Levemir ordered with stopping TPN)  Additional fluids per MD Pharmacy will sign-off consult - please re-consult if needed.   Sloan Leiter, PharmD, BCPS, BCCCP Clinical Pharmacist Clinical phone 08/29/2020 until 2:30PM [phone] Please refer to [facility] for [place] numbers 08/29/2020 7:40 AM

## 2020-08-30 ENCOUNTER — Encounter (HOSPITAL_COMMUNITY): Payer: Self-pay | Admitting: Surgery

## 2020-08-30 LAB — COMPREHENSIVE METABOLIC PANEL
ALT: 23 U/L (ref 0–44)
AST: 29 U/L (ref 15–41)
Albumin: 1.5 g/dL — ABNORMAL LOW (ref 3.5–5.0)
Alkaline Phosphatase: 97 U/L (ref 38–126)
Anion gap: 11 (ref 5–15)
BUN: 41 mg/dL — ABNORMAL HIGH (ref 8–23)
CO2: 20 mmol/L — ABNORMAL LOW (ref 22–32)
Calcium: 7.6 mg/dL — ABNORMAL LOW (ref 8.9–10.3)
Chloride: 102 mmol/L (ref 98–111)
Creatinine, Ser: 2.9 mg/dL — ABNORMAL HIGH (ref 0.44–1.00)
GFR, Estimated: 17 mL/min — ABNORMAL LOW (ref >60)
Glucose, Bld: 84 mg/dL (ref 70–99)
Potassium: 4.5 mmol/L (ref 3.5–5.1)
Sodium: 133 mmol/L — ABNORMAL LOW (ref 135–145)
Total Bilirubin: 2.1 mg/dL — ABNORMAL HIGH (ref 0.3–1.2)
Total Protein: 5.1 g/dL — ABNORMAL LOW (ref 6.5–8.1)

## 2020-08-30 LAB — GLUCOSE, CAPILLARY
Glucose-Capillary: 104 mg/dL — ABNORMAL HIGH (ref 70–99)
Glucose-Capillary: 124 mg/dL — ABNORMAL HIGH (ref 70–99)
Glucose-Capillary: 128 mg/dL — ABNORMAL HIGH (ref 70–99)
Glucose-Capillary: 135 mg/dL — ABNORMAL HIGH (ref 70–99)
Glucose-Capillary: 80 mg/dL (ref 70–99)
Glucose-Capillary: 84 mg/dL (ref 70–99)

## 2020-08-30 LAB — CBC
HCT: 24.8 % — ABNORMAL LOW (ref 36.0–46.0)
Hemoglobin: 8.1 g/dL — ABNORMAL LOW (ref 12.0–15.0)
MCH: 30.2 pg (ref 26.0–34.0)
MCHC: 32.7 g/dL (ref 30.0–36.0)
MCV: 92.5 fL (ref 80.0–100.0)
Platelets: 284 10*3/uL (ref 150–400)
RBC: 2.68 MIL/uL — ABNORMAL LOW (ref 3.87–5.11)
RDW: 14 % (ref 11.5–15.5)
WBC: 27.6 10*3/uL — ABNORMAL HIGH (ref 4.0–10.5)
nRBC: 0 % (ref 0.0–0.2)

## 2020-08-30 MED ORDER — PIPERACILLIN-TAZOBACTAM 3.375 G IVPB: 3.3750 g | Freq: Three times a day (TID) | INTRAVENOUS | Status: DC

## 2020-08-30 MED ORDER — HYDROMORPHONE HCL 1 MG/ML IJ SOLN
1.0000 mg | INTRAMUSCULAR | Status: DC | PRN
  Administered 2020-08-30 – 2020-08-31 (×6): 1 mg via INTRAVENOUS
  Filled 2020-08-30 (×6): qty 1

## 2020-08-30 MED ORDER — PIPERACILLIN-TAZOBACTAM IN DEX 2-0.25 GM/50ML IV SOLN
2.2500 g | Freq: Three times a day (TID) | INTRAVENOUS | Status: DC
  Administered 2020-08-30 – 2020-08-31 (×2): 2.25 g via INTRAVENOUS
  Filled 2020-08-30 (×4): qty 50

## 2020-08-30 MED ORDER — PIPERACILLIN-TAZOBACTAM 3.375 G IVPB
3.3750 g | Freq: Three times a day (TID) | INTRAVENOUS | Status: DC
  Administered 2020-08-30: 3.375 g via INTRAVENOUS
  Filled 2020-08-30: qty 50

## 2020-08-30 MED ORDER — SODIUM CHLORIDE 0.9 % IV BOLUS
500.0000 mL | Freq: Once | INTRAVENOUS | Status: AC
  Administered 2020-08-30: 500 mL via INTRAVENOUS

## 2020-08-30 MED ORDER — ALBUMIN HUMAN 5 % IV SOLN
25.0000 g | Freq: Once | INTRAVENOUS | Status: AC
  Administered 2020-08-30: 12.5 g via INTRAVENOUS
  Filled 2020-08-30: qty 500

## 2020-08-30 NOTE — Progress Notes (Signed)
Pt had 2 non-sustained runs of SVT last night, at 0120- 14 beat run with HR 180, and at 0439- 10 beat run with HR 141. The rhythm corrected itself without intervention and pt was asymptomatic. Dr. Bobbye Morton and Dr. Dema Severin notified, received new order from Dr. Dema Severin to obtain EKG. Tube feed order also clarified with Dr. Dema Severin at this time, Dr. Dema Severin states to start tube feed at 55 ml/hr, slow titration not needed. Passed on to day shift RN.

## 2020-08-30 NOTE — Progress Notes (Signed)
[facility])  Referral received for hospice services at home once discharged.  Spoke with daughter Maudie Mercury, plan is to discharge home on Monday with same day AV visit with [facility]. DME has been ordered, waiting on delivery today.   Please arrange for comfort meds at time of d/c so there is no lapse in her symptoms prior to hospice arriving in the home.  Thank you, Clementeen Hoof, BSN, [facility] [phone]

## 2020-08-30 NOTE — Progress Notes (Signed)
Trauma/Critical Care Follow Up Note  Subjective:    Overnight Issues:   Objective:  Vital signs for last 24 hours: Temp:  [97.3 F (36.3 C)-98.3 F (36.8 C)] 97.8 F (36.6 C) (01/30 0725) Pulse Rate:  [95-109] 95 (01/30 0725) Resp:  [12-26] 14 (01/30 0733) BP: (103-152)/(42-90) 127/50 (01/30 0725) SpO2:  [94 %-100 %] 97 % (01/30 0733) FiO2 (%):  [0 %] 0 % (01/30 0527)  Hemodynamic parameters for last 24 hours:    Intake/Output from previous day: 01/29 0701 - 01/30 0700 In: -  Out: 2055 [Urine:600; Drains:1455]  Intake/Output this shift: No intake/output data recorded.  Vent settings for last 24 hours: FiO2 (%):  [0 %] 0 %  Physical Exam:  Gen: comfortable, no distress Neuro: non-focal exam HEENT: PERRL Neck: supple CV: RRR Pulm: unlabored breathing Abd: soft, appropriately TTP, JPx5 R abd, g-tube clamped, j-tube with TF GU: urine lighter today Extr: wwp, no edema   Results for orders placed or performed during the hospital encounter of 08/21/20 (from the past 24 hour(s))  Glucose, capillary     Status: Abnormal   Collection Time: 08/29/20 11:45 AM  Result Value Ref Range   Glucose-Capillary 146 (H) 70 - 99 mg/dL  Glucose, capillary     Status: Abnormal   Collection Time: 08/29/20  3:39 PM  Result Value Ref Range   Glucose-Capillary 147 (H) 70 - 99 mg/dL  Glucose, capillary     Status: Abnormal   Collection Time: 08/29/20  7:43 PM  Result Value Ref Range   Glucose-Capillary 137 (H) 70 - 99 mg/dL  Sodium, urine, random     Status: None   Collection Time: 08/29/20  9:50 PM  Result Value Ref Range   Sodium, Ur <10 mmol/L  Creatinine, urine, random     Status: None   Collection Time: 08/29/20  9:50 PM  Result Value Ref Range   Creatinine, Urine 90.25 mg/dL  Glucose, capillary     Status: Abnormal   Collection Time: 08/29/20 11:38 PM  Result Value Ref Range   Glucose-Capillary 105 (H) 70 - 99 mg/dL  Glucose, capillary     Status: None   Collection  Time: 08/30/20  3:50 AM  Result Value Ref Range   Glucose-Capillary 84 70 - 99 mg/dL  CBC     Status: Abnormal   Collection Time: 08/30/20  5:00 AM  Result Value Ref Range   WBC 27.6 (H) 4.0 - 10.5 K/uL   RBC 2.68 (L) 3.87 - 5.11 MIL/uL   Hemoglobin 8.1 (L) 12.0 - 15.0 g/dL   HCT 24.8 (L) 36.0 - 46.0 %   MCV 92.5 80.0 - 100.0 fL   MCH 30.2 26.0 - 34.0 pg   MCHC 32.7 30.0 - 36.0 g/dL   RDW 14.0 11.5 - 15.5 %   Platelets 284 150 - 400 K/uL   nRBC 0.0 0.0 - 0.2 %  Comprehensive metabolic panel     Status: Abnormal   Collection Time: 08/30/20  5:00 AM  Result Value Ref Range   Sodium 133 (L) 135 - 145 mmol/L   Potassium 4.5 3.5 - 5.1 mmol/L   Chloride 102 98 - 111 mmol/L   CO2 20 (L) 22 - 32 mmol/L   Glucose, Bld 84 70 - 99 mg/dL   BUN 41 (H) 8 - 23 mg/dL   Creatinine, Ser 2.90 (H) 0.44 - 1.00 mg/dL   Calcium 7.6 (L) 8.9 - 10.3 mg/dL   Total Protein 5.1 (L) 6.5 - 8.1  g/dL   Albumin 1.5 (L) 3.5 - 5.0 g/dL   AST 29 15 - 41 U/L   ALT 23 0 - 44 U/L   Alkaline Phosphatase 97 38 - 126 U/L   Total Bilirubin 2.1 (H) 0.3 - 1.2 mg/dL   GFR, Estimated 17 (L) >60 mL/min   Anion gap 11 5 - 15  Glucose, capillary     Status: None   Collection Time: 08/30/20  7:22 AM  Result Value Ref Range   Glucose-Capillary 80 70 - 99 mg/dL    Assessment & Plan: The plan of care was discussed with the bedside nurse for the day, who is in agreement with this plan and no additional concerns were raised.   Present on Admission: **None**    LOS: 7 days   Additional comments:I reviewed the patient's new clinical lab test results.   and I reviewed the patients new imaging test results.    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 Onc has signed off. Abdominal wall fluid collection - sp drain placement x3 1/29, cont abx x4 additional days, transition to oral at discharge. AKI - has been gently hydrated, nephrotoxic agents d/c'd, urine studies suggestive of pre-renal, given albumin today, renal consult Wound care- wtd daily, JP drain teaching  Pain control -d/c PCA, transition to oral pain regimen ID - cont zosyn FEN -reg diet, TPN off, clampg-tube unless nauseated,incr j-tube feeds to 55/h DVT - SCDs,LMWH Dispo -4NP, encouraged ambulation/OOB to chair  Jesusita Oka, MD Trauma & General Surgery Please use [email or web address] to contact on call provider  08/30/2020  *Care during the described time interval was provided by me. I have reviewed this patient's available data, including medical history, events of note, physical examination and test results as part of my evaluation.

## 2020-08-30 NOTE — Progress Notes (Signed)
PHARMACY NOTE:  ANTIMICROBIAL RENAL DOSAGE ADJUSTMENT  Current antimicrobial regimen includes a mismatch between antimicrobial dosage and estimated renal function.  As per policy approved by the Pharmacy & Therapeutics and Medical Executive Committees, the antimicrobial dosage will be adjusted accordingly.  Current antimicrobial dosage:  Zosyn 3.375g IV every 8 hours - extended infusion   Indication: intra-abdominal infection  Renal Function:  Estimated Creatinine Clearance: 19.5 mL/min (A) (by C-G formula based on SCr of 2.9 mg/dL (H)). []      On intermittent HD, scheduled: []      On CRRT    Antimicrobial dosage has been changed to:  Zosyn 2.25g IV every 8 hours.   Additional comments:   Thank you for allowing pharmacy to be a part of this patient's care.  Brain Hilts, [facility] 08/30/2020 12:00 PM

## 2020-08-30 NOTE — Consult Note (Addendum)
Renal Service Consult Note [facility]  Faith Morgan 08/30/2020 Sol Blazing, MD Requesting Physician: Dr Bobbye [facility]  Reason for Consult: Renal failure HPI: The patient is a 70 y.o. year-old w/ no PMH was admitted w/ RUQ pain and was dx'd w/ cholecystitis. She went for lap chole per Dr Bobbye [facility] on 08/21/20. 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 She was taken back to OR for exlap, gastrojejunostomy, G and J tube placements , omental pedicle flap coverage of large duodenal fistula and additional drains. 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 The patient was given TPN and met with Oncology. MRI showed 5 x 4 cm enhancing lesion in the GB fossa and was highly suggestive of tumor spread into the liver. There was also a 2 cm enhancing liver lesion in the caudate lobe compatible w/ metastatic disease. Options were presented to the patient and she said she was not considering any chemo or surgery and that she would like to proceed with hospice. Onc signed off. Renal function was stable w/ creat 0.6- 1.0 until 1/27 when creat bumped up to 1.40 and now today on 1/30 creat is up to 2.90.  BUN 41, CO2 20, Na 133. No high K+.  Alb 1.5 very low. WBC ^^ 28k, up from low of 8.9 4-5 days ago. Hb 8.1.  Temps have been wnl. Asked to see for renal failure.   I/O since admit are 16L in and 16 L out.  Wt's are up 10- 18 lbs from pt's spoken baseline weight of 199 lbs.  No BP drops around 1/26 or 1/27.Pt did have climbing WBC starting on 1/26 and also some low body temps around 1/26 and 1/27.  Pt has been on IV zosyn for 7 days, no other current abx.  WBC peaked at 28k yest and is down to 27k today.   Pt seen in room, has no c/o other than "pain".  Has a purewick catheter in place.  Has had a fair amount of fluid losses per her many drains >> has had 11 L out since drains were placed altogether.   UOP was good yesteray around 900 cc and also  today so far around 900 cc. No UOP was recorded from 12/6- 1/28.       ROS  denies CP  no joint pain   no HA  no blurry vision  no rash     Past Medical History  Past Medical History:  Diagnosis Date  . Eczema   . Liver cyst 07/2020   inconclusive via ultrasound   Past Surgical History  Past Surgical History:  Procedure Laterality Date  . CHOLECYSTECTOMY N/A 08/21/2020   Procedure: LAPAROSCOPIC CHOLECYSTECTOMY, SUBTOTAL;  Surgeon: Jesusita Oka, MD;  Location: [place];  Service: General;  Laterality: N/A;  . COLONOSCOPY    . EYE SURGERY Bilateral 03/2020   cataract removal  . GASTROJEJUNOSTOMY N/A 08/23/2020   Procedure: PYLORIC EXCLUSION; GASTROJEJUNOSTOMY;  Surgeon: Clovis Riley, MD;  Location: [facility];  Service: General;  Laterality: N/A;  . GASTROSTOMY Left 08/23/2020   Procedure: INSERTION OF GASTROSTOMY TUBE;  Surgeon: Clovis Riley, MD;  Location: [place];  Service: General;  Laterality: Left;  . JEJUNOSTOMY  08/23/2020   Procedure: Shanon Rosser;  Surgeon: Clovis Riley, MD;  Location: [place];  Service: General;;  . LAPAROTOMY N/A 08/23/2020   Procedure: EXPLORATORY LAPAROTOMY; OMENTAL PEDICLE FLAP;  Surgeon: Clovis Riley, MD;  Location: [place];  Service: General;  Laterality: N/A;   Family History History reviewed. No pertinent family history. Social History  reports that she has quit smoking. She has never used smokeless tobacco. She reports that she does not drink alcohol and does not use drugs. Allergies  Allergies  Allergen Reactions  . Crestor [Rosuvastatin] Other (See Comments)    Muscle pain   Home medications Prior to Admission medications   Medication Sig Start Date End Date Taking? Authorizing Provider  Cholecalciferol (DIALYVITE VITAMIN D 5000) 125 MCG (5000 UT) capsule Take 5,000 Units by mouth daily.   Yes [provider]  Multiple Vitamin (MULTIVITAMIN WITH MINERALS) TABS tablet Take 1 tablet by mouth daily.   Yes [provider]     Vitals:   08/30/20 2244 08/30/20 1120 08/30/20 1500 08/30/20 1945  BP:  131/64 128/74 (!) 153/63  Pulse:  (!) 106  (!) 109  Resp: '14 20  20  ' Temp:  98.1 F (36.7 C) 97.9 F (36.6 C) 98.8 F (37.1 C)  TempSrc:   Oral Oral  SpO2: 97% 96%  96%  Weight:      Height:       Exam Gen pt is alert, obese, not in distress No rash, cyanosis or gangrene Sclera anicteric, throat clear  No jvd or bruits Chest clear bilat to bases no rales or wheezing RRR no MRG Abd large dressing mid abdomen, multiple drains R abdomen G-tube and J-tube in place GU purewick draining dark amber urine in small amounts MS no joint effusions or deformity Ext 1-2 + pitting leg edema, no wounds or ulcers Neuro is alert, Ox 3    UA 1/26-- 11-20 rbc/ epi, 0-5 wbc, prot 30    1/29 -- UNa < 10, UCr 90    Pt rec'd 100 mg IV contrast on 1/23.  Pt rec'd 66m IV contrast for CT scan on 1/25.  Pt rec'd IV toradol multiples doses from 1/25 - 1/27   Assessment/ Plan: 1. AKI - UA unremarkable, UNa is low.  Lots of drain losses over the past 8-9 days. Did have IV contrast exposure x 2 and IV NSAID exposure. Suspect AKI is ATN due to contrast/ IV nsaids and relative intravac vol depletion due to hypoalbuminemia as well as possible mild septic picture around 1/26 w/ a rising WBC which seems to be improving now.  UOP was down but yesterday and today is much better. May be a sign of recovery.  Weights are up , +leg edema which is not severe. Is getting NS at 75 cc/hr.  Agree.  She has some vol excess but clear lungs and most of her excess vol is 3rd spaced. NSAID's were dc'd on 1/27. Avoid any further contrast/ nsaids/ ACEi/ ARB/ IV vanc. will bolus 500 cc NS and recommend cont IVF"s at 75 /hr. Hopefully will turn around. Please call w/ any questions.  Will sign off.  2. Gallbladder adenoCa - pt declined any chemoRx or surgery, requesting hospice Rx. Per RN and per chart plan is for dc home tomorrow w/ comfort  meds.   3. SP cholecystectomy       RKelly Splinter MD 08/30/2020, 10:07 PM  Recent Labs  Lab 08/29/20 0415 08/30/20 0500  WBC 28.2* 27.6*  HGB 8.8* 8.1*   Recent Labs  Lab 08/27/20 0419 08/28/20 0500 08/28/20 2125 08/29/20 0858 08/30/20 0500  K 4.3 4.2   < > 4.5 4.5  BUN 21 31*   < > 38* 41*  CREATININE 1.40*  2.00*   < > 2.48* 2.90*  CALCIUM 6.8* 7.1*   < > 7.4* 7.6*  PHOS 3.1 2.8  --   --   --    < > = values in this interval not displayed.

## 2020-08-31 DIAGNOSIS — G893 Neoplasm related pain (acute) (chronic): Secondary | ICD-10-CM

## 2020-08-31 LAB — CULTURE, BLOOD (ROUTINE X 2)
Culture: NO GROWTH
Culture: NO GROWTH
Special Requests: ADEQUATE
Special Requests: ADEQUATE

## 2020-08-31 LAB — CBC
HCT: 24.3 % — ABNORMAL LOW (ref 36.0–46.0)
Hemoglobin: 8 g/dL — ABNORMAL LOW (ref 12.0–15.0)
MCH: 30.1 pg (ref 26.0–34.0)
MCHC: 32.9 g/dL (ref 30.0–36.0)
MCV: 91.4 fL (ref 80.0–100.0)
Platelets: 323 10*3/uL (ref 150–400)
RBC: 2.66 MIL/uL — ABNORMAL LOW (ref 3.87–5.11)
RDW: 14 % (ref 11.5–15.5)
WBC: 19.2 10*3/uL — ABNORMAL HIGH (ref 4.0–10.5)
nRBC: 0 % (ref 0.0–0.2)

## 2020-08-31 LAB — GLUCOSE, CAPILLARY
Glucose-Capillary: 156 mg/dL — ABNORMAL HIGH (ref 70–99)
Glucose-Capillary: 124 mg/dL — ABNORMAL HIGH (ref 70–99)
Glucose-Capillary: 147 mg/dL — ABNORMAL HIGH (ref 70–99)
Glucose-Capillary: 113 mg/dL — ABNORMAL HIGH (ref 70–99)

## 2020-08-31 LAB — COMPREHENSIVE METABOLIC PANEL
ALT: 22 U/L (ref 0–44)
AST: 32 U/L (ref 15–41)
Albumin: 1.8 g/dL — ABNORMAL LOW (ref 3.5–5.0)
Alkaline Phosphatase: 92 U/L (ref 38–126)
Anion gap: 9 (ref 5–15)
BUN: 20 mg/dL (ref 8–23)
CO2: 20 mmol/L — ABNORMAL LOW (ref 22–32)
Calcium: 7.6 mg/dL — ABNORMAL LOW (ref 8.9–10.3)
Chloride: 107 mmol/L (ref 98–111)
Creatinine, Ser: 1.04 mg/dL — ABNORMAL HIGH (ref 0.44–1.00)
GFR, Estimated: 58 mL/min — ABNORMAL LOW (ref >60)
Glucose, Bld: 168 mg/dL — ABNORMAL HIGH (ref 70–99)
Potassium: 4 mmol/L (ref 3.5–5.1)
Sodium: 136 mmol/L (ref 135–145)
Total Bilirubin: 0.9 mg/dL (ref 0.3–1.2)
Total Protein: 5.2 g/dL — ABNORMAL LOW (ref 6.5–8.1)

## 2020-08-31 MED ORDER — HYDROMORPHONE 1 MG/ML IV SOLN: INTRAVENOUS | 0 refills | Status: AC

## 2020-08-31 MED ORDER — DIPHENHYDRAMINE HCL 50 MG/ML IJ SOLN: 12.5000 mg | Freq: Four times a day (QID) | INTRAMUSCULAR | Status: DC | PRN

## 2020-08-31 MED ORDER — DIPHENHYDRAMINE HCL 12.5 MG/5ML PO ELIX: 12.5000 mg | ORAL_SOLUTION | Freq: Four times a day (QID) | ORAL | Status: DC | PRN

## 2020-08-31 MED ORDER — NALOXONE HCL 0.4 MG/ML IJ SOLN
0.4000 mg | INTRAMUSCULAR | Status: DC | PRN
Start: 2020-08-31 — End: 2020-09-02

## 2020-08-31 MED ORDER — FREE WATER
250.0000 mL | Status: DC
  Administered 2020-08-31 – 2020-09-01 (×10): 250 mL

## 2020-08-31 MED ORDER — HYDROMORPHONE 1 MG/ML IV SOLN
INTRAVENOUS | Status: DC
  Administered 2020-08-31: 30 mg via INTRAVENOUS
  Filled 2020-08-31: qty 30

## 2020-08-31 MED ORDER — SODIUM CHLORIDE 0.9% FLUSH: 9.0000 mL | INTRAVENOUS | Status: DC | PRN

## 2020-08-31 MED ORDER — ONDANSETRON HCL 4 MG/2ML IJ SOLN: 4.0000 mg | Freq: Four times a day (QID) | INTRAMUSCULAR | Status: DC | PRN

## 2020-08-31 MED ORDER — HYDROMORPHONE 1 MG/ML IV SOLN
INTRAVENOUS | Status: DC
Start: 2020-08-31 — End: 2020-09-02
  Administered 2020-09-01: 3.59 mg via INTRAVENOUS
  Administered 2020-09-01: 2.86 mg via INTRAVENOUS
  Administered 2020-09-01: 1.72 mg via INTRAVENOUS

## 2020-08-31 MED ORDER — NALOXONE HCL 0.4 MG/ML IJ SOLN: 0.4000 mg | INTRAMUSCULAR | Status: DC | PRN

## 2020-08-31 MED ORDER — PIPERACILLIN-TAZOBACTAM 3.375 G IVPB
3.3750 g | Freq: Three times a day (TID) | INTRAVENOUS | Status: DC
  Administered 2020-08-31 – 2020-09-01 (×4): 3.375 g via INTRAVENOUS
  Filled 2020-08-31 (×4): qty 50

## 2020-08-31 NOTE — Progress Notes (Signed)
Referring Physician(s): Dr. Bedelia Person  Supervising Physician: Marliss Coots  Patient Status:  [facility] - In-pt  Chief Complaint:  Suspected gallbladder cancer S/p cholecystectomy with RUQ drain placement by Dr. Bedelia Person (surgery) 08/21/20. She returned to the OR with Dr. 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 Archer Asa  Subjective: Patient in bed with lunch tray in front of her. She is ill-appearing but not in acute distress. 5 right-sided abdominal drains (Drains 3, 4, & 5 are IR's) intact; midline wound packed/covered; gastrostomy tube; jejunostomy tube. Per RN the patient is going home tomorrow with home hospice.   Allergies: Crestor [rosuvastatin]  Medications: Prior to Admission medications   Medication Sig Start Date End Date Taking? Authorizing Provider  Cholecalciferol (DIALYVITE VITAMIN D 5000) 125 MCG (5000 UT) capsule Take 5,000 Units by mouth daily.   Yes [provider]  Multiple Vitamin (MULTIVITAMIN WITH MINERALS) TABS tablet Take 1 tablet by mouth daily.   Yes [provider]     Vital Signs: BP (!) 143/70 (BP Location: Left Arm)   Pulse 98   Temp 98.5 F (36.9 C) (Oral)   Resp 20   Ht 5\' 1"  (1.549 m)   Wt 225 lb 1.4 oz (102.1 kg)   SpO2 96%   BMI 42.53 kg/m   Physical Exam Constitutional:      General: She is not in acute distress.    Appearance: She is ill-appearing.  Cardiovascular:     Rate and Rhythm: Normal rate and regular rhythm.  Pulmonary:     Effort: Pulmonary effort is normal.  Abdominal:     General: There is distension.     Tenderness: There is abdominal tenderness.      Comments: Anasarca/redness to abdomen. 5 right sided drains, a midline drain, two drains on the left. IR drains are #3, 4 and 5 (numbered on the JP bulb). Thick bilious fluid in each JP. Each drain flushed easily with 5 ml NS.   Skin:    General: Skin is warm and dry.  Neurological:     Mental Status: She is alert and oriented to person, place, and time.     Imaging: CT ABDOMEN PELVIS WO CONTRAST  Result Date: 08/29/2020 CLINICAL DATA:  70 year old female with abdominal pain and fever status post subtotal cholecystectomy, diverting gastrojejunostomy and duodenal repair for necrotic cholecystitis and duodenal leak. Percutaneous surgical drains, gastrostomy and direct jejunostomy tubes are present. EXAM: CT ABDOMEN AND PELVIS WITHOUT CONTRAST TECHNIQUE: Multidetector CT imaging of the abdomen and pelvis was performed following the standard protocol without IV contrast. COMPARISON:  MR abdomen 08/25/2020; CT abdomen/pelvis 08/23/2020 FINDINGS: Lower chest: Similar degree of small right pleural effusion and associated right basilar atelectasis. Chronic bilateral lower lobe bronchial wall thickening. Subsegmental atelectasis also present in the left lower lobe. No acute abnormality. Hepatobiliary: Improving fluid and gas collection in the region the gallbladder fossa. It is unclear if this represents the residual gallbladder versus a postoperative collection. The collection measures 3.5 x 2.7 cm. Significantly improved perihepatic fluid collection. The previously placed drainage catheter has changed in position. No longer extends up over the liver into the subdiaphragmatic space but now rather rests in the sub hepatic space. Pancreas: Unremarkable. No pancreatic ductal dilatation or surrounding  inflammatory changes. Spleen: Normal in size without focal abnormality. Adrenals/Urinary Tract: Adrenal glands are unremarkable. Mild hydronephrosis. The bladder is distended with urine. Stomach/Bowel: Surgical changes  of pyloric exclusion and retro colic retro gastric gastro jejunostomy. A percutaneous gastrostomy tube has been placed. The gastrostomy tube appears well position. No inflammatory changes along the tube tract. There is a direct percutaneous jejunostomy tube as well. This also appears intact and unremarkable. Additional surgical changes are present in the region of the duodenum likely consistent with omental pedicle flap wrapping. Significantly decreased intra-abdominal air. Small loculation present inferior to hepatic segment 4B. Vascular/Lymphatic: Limited evaluation in the absence of intravenous contrast. No suspicious lymphadenopathy. Reproductive: Uterus and bilateral adnexa are unremarkable. Other: Persistent fluid collection within the right lateral abdominal wall musculature measuring approximately 12.3 x 2.8 x 6.7 cm. Fluid collection is mixed in attenuation with areas of high attenuation which may represent residual oral contrast material versus hemorrhage. The more superior percutaneous surgical drain does pass through the superior aspect of the collection. There are extensive surrounding inflammatory changes as well as subcutaneous emphysema within the subcutaneous fat of the right lower quadrant abdominal wall. A second surgical drain is present in the right lower quadrant. This terminates in the anatomic pelvis. There is no fluid collection near this drain. Musculoskeletal: No acute fracture or aggressive appearing lytic or blastic osseous lesion. Open midline incision. Anasarca. Multilevel degenerative disc disease. IMPRESSION: 1. Approximately 3.5 x 2.7 x 2 cm (volume = 10 cm^3) fluid and gas collection in the region of the gallbladder fossa. It is unclear if this represents residual gallbladder fossa abscess or the remnants of the previously partially resected gallbladder. 2. Persistent large complex fluid collection within the right anterolateral abdominal wall musculature measuring approximately  12.3 x 2.8 x 6.7 cm (volume = 120 cm^3). The mixed attenuation suggests either internal hemorrhage or a small amount of residual oral contrast material which was noted on the prior CT scan. The more superior surgical drain does pass through the superior aspect of this collection before it enters the abdomen. 3. Near-total interval resolution of intraperitoneal air with only a small focal loculation in the subhepatic space. 4. The more inferior surgical drainage catheter does not passed through any drainable fluid collection and may no longer be needed. 5. Well-positioned percutaneous gastrostomy and direct jejunostomy tubes without evidence of complication. 6. Mild bilateral hydronephrosis and marked distention of the bladder suggests urinary retention. 7. Interval development of anasarca which may reflect volume overload, CHF or third spacing. 8. Surgical changes of pyloric exclusion with retro colic/retrogastric gastrojejunostomy and omental wrap repair of duodenal perforation with open midline incision. 9. Stable small right pleural effusion and bibasilar atelectasis. Electronically Signed   By: Jacqulynn Cadet M.D.   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 She is status post extensive surgery including partial cholecystectomy, pyloric exclusion, gastro jejunostomy, percutaneous gastrostomy, percutaneous jejunostomy and multiple surgical drain placement. CT imaging demonstrates persistent fluid collections within the gallbladder fossa, in the perihepatic space and within the right abdominal sidewall. She presents for placement of multiple percutaneous drainage catheters. EXAM: CT GUIDED DRAINAGE OF GALLBLADDER FOSSA COLLECTION CT GUIDED DRAINAGE OF PERIHEPATIC COLLECTION CT GUIDED DRAINAGE OF RIGHT ABDOMINAL WALL INTRAMUSCULAR  COLLECTION MEDICATIONS: The patient is currently admitted to the hospital and receiving intravenous antibiotics. The antibiotics were administered within an appropriate time  frame prior to the initiation of the procedure. ANESTHESIA/SEDATION: 2 mg IV Versed 100 mcg IV Fentanyl Moderate Sedation Time: 58 minutes The patient was continuously monitored during the procedure by the interventional radiology nurse under my direct supervision. COMPLICATIONS: None immediate. TECHNIQUE: Informed written consent was obtained from the patient after a thorough discussion of the procedural risks, benefits and alternatives. All questions were addressed. Maximal Sterile Barrier Technique was utilized including caps, mask, sterile gowns, sterile gloves, sterile drape, hand hygiene and skin antiseptic. A timeout was performed prior to the initiation of the procedure. PROCEDURE: The operative field was prepped with Chlorhexidine in a sterile fashion, and a sterile drape was applied covering the operative field. A sterile gown and sterile gloves were used for the procedure. Local anesthesia was provided with 1% Lidocaine. Attention was first turned to the perihepatic fluid collection. CT imaging was performed. Local anesthesia was attained by infiltration with 1% lidocaine. A small dermatotomy was made. Under intermittent CT guidance, an 18 gauge trocar needle was carefully advanced into the perihepatic fluid collection. A 0.035 wire was then advanced into the collection. The tract was dilated to 31 [place]. A Cook 12 [place] all-purpose drainage catheter was advanced over the wire and formed. Aspiration yields copious viscous bilious fluid with internal debris. The catheter was connected to JP bulb suction and secured to the skin with 0 Prolene suture. Attention was next turned to the fluid and gas collection in the gallbladder fossa. Using similar technique, a suitable skin entry site was identified. Local anesthesia was attained by  infiltration with 1% lidocaine. A small dermatotomy was made. A new 18 gauge trocar needle was carefully advanced along a short transhepatic tract and into the gallbladder fossa collection. A 0.035 wire was then formed in the collection. The percutaneous and transhepatic tract were dilated to 12 [place]. A 12 French all-purpose drainage catheter was then advanced over the wire and formed. Aspiration yields additional bilious fluid and debris. This drainage catheter was connected to JP bulb suction and secured to the skin with 0 Prolene suture. Finally, attention was turned to the right abdominal wall intramuscular fluid collection. A suitable skin entry site was identified by CT imaging. Local anesthesia was again attained by infiltration with 1% lidocaine. A small dermatotomy was made. A third 18 gauge trocar needle was carefully advanced into the fluid collection. A new 0.035 wire was formed in the collection. The percutaneous tract was dilated to 14 [place]. A 14 French all-purpose drainage catheter was then advanced over the wire and formed. Aspiration yields copious bilious fluid, old blood and debris. The drainage catheter was connected to JP bulb suction and secured to the skin with 0 Prolene suture. FINDINGS: Copious viscous bilious fluid and debris at all 3 drainage sites. IMPRESSION: 1. Successful placement of a total of 3 percutaneous drainage catheters (12 [place] into the perihepatic space, 12 [place] into the gallbladder fossa via a transhepatic course, 70 French into the right abdominal wall intramuscular collection). 2. Aspiration at all 3 sites yields over 500 mL of thick viscous bilious fluid and debris. Following aspiration, no further drainage of bilious fluid was identified from the previously placed surgical drain entry site. Electronically Signed   By: Jacqulynn Cadet M.D.   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 She is status post extensive surgery including partial cholecystectomy, pyloric  exclusion, gastro jejunostomy, percutaneous gastrostomy, percutaneous jejunostomy and multiple surgical drain placement. CT imaging demonstrates persistent fluid collections within the gallbladder fossa, in the perihepatic space and within the right abdominal sidewall. She presents for placement of multiple percutaneous drainage catheters. EXAM: CT GUIDED DRAINAGE OF GALLBLADDER FOSSA COLLECTION CT GUIDED DRAINAGE OF PERIHEPATIC COLLECTION CT GUIDED DRAINAGE OF RIGHT ABDOMINAL WALL INTRAMUSCULAR COLLECTION MEDICATIONS: The patient is currently admitted to the hospital and receiving intravenous antibiotics. The antibiotics were administered within an appropriate time frame prior to the initiation of the procedure. ANESTHESIA/SEDATION: 2 mg IV Versed 100 mcg IV Fentanyl Moderate Sedation Time: 58 minutes The patient was continuously monitored during the procedure by the interventional radiology nurse under my direct supervision. COMPLICATIONS: None immediate. TECHNIQUE: Informed written consent was obtained from the patient after a thorough discussion of the procedural risks, benefits and alternatives. All questions were addressed. Maximal Sterile Barrier Technique was utilized including caps, mask, sterile gowns, sterile gloves, sterile drape, hand hygiene and skin antiseptic. A timeout was performed prior to the initiation of the procedure. PROCEDURE: The operative field was prepped with Chlorhexidine in a sterile fashion, and a sterile drape was applied covering the operative field. A sterile gown and sterile gloves were used for the procedure. Local anesthesia was provided with 1% Lidocaine. Attention was first turned to the perihepatic fluid collection. CT imaging was performed. Local anesthesia was attained by infiltration with 1%  lidocaine. A small dermatotomy was made. Under intermittent CT guidance, an 18 gauge trocar needle was carefully advanced into the perihepatic fluid collection. A 0.035 wire was then advanced into the collection. The tract was dilated to 79 [place]. A Cook 12 [place] all-purpose drainage catheter was advanced over the wire and formed. Aspiration yields copious viscous bilious fluid with internal debris. The catheter was connected to JP bulb suction and secured to the skin with 0 Prolene suture. Attention was next turned to the fluid and gas collection in the gallbladder fossa. Using similar technique, a suitable skin entry site was identified. Local anesthesia was attained by infiltration with 1% lidocaine. A small dermatotomy was made. A new 18 gauge trocar needle was carefully advanced along a short transhepatic tract and into the gallbladder fossa collection. A 0.035 wire was then formed in the collection. The percutaneous and transhepatic tract were dilated to 12 [place]. A 12 French all-purpose drainage catheter was then advanced over the wire and formed. Aspiration yields additional bilious fluid and debris. This drainage catheter was connected to JP bulb suction and secured to the skin with 0 Prolene suture. Finally, attention was turned to the right abdominal wall intramuscular fluid collection. A suitable skin entry site was identified by CT imaging. Local anesthesia was again attained by infiltration with 1% lidocaine. A small dermatotomy was made. A third 18 gauge trocar needle was carefully advanced into the fluid collection. A new 0.035 wire was formed in the collection. The percutaneous tract was dilated to 14 [place]. A 14 French all-purpose drainage catheter was then advanced over the wire and formed. Aspiration yields copious bilious fluid, old blood and debris. The drainage catheter was connected to JP bulb suction and secured to the skin with 0 Prolene suture. FINDINGS: Copious viscous bilious fluid  and debris at all 3 drainage sites. IMPRESSION: 1. Successful placement of a total of 3 percutaneous drainage catheters (12 [place] into the perihepatic space, 12 [place] into the gallbladder fossa via a transhepatic course, 74 French into the right abdominal wall intramuscular collection). 2. Aspiration at  all 3 sites yields over 500 mL of thick viscous bilious fluid and debris. Following aspiration, no further drainage of bilious fluid was identified from the previously placed surgical drain entry site. Electronically Signed   By: Jacqulynn Cadet M.D.   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 She is status post extensive surgery including partial cholecystectomy, pyloric exclusion, gastro jejunostomy, percutaneous gastrostomy, percutaneous jejunostomy and multiple surgical drain placement. CT imaging demonstrates persistent fluid collections within the gallbladder fossa, in the perihepatic space and within the right abdominal sidewall. She presents for placement of multiple percutaneous drainage catheters. EXAM: CT GUIDED DRAINAGE OF GALLBLADDER FOSSA COLLECTION CT GUIDED DRAINAGE OF PERIHEPATIC COLLECTION CT GUIDED DRAINAGE OF RIGHT ABDOMINAL WALL INTRAMUSCULAR COLLECTION MEDICATIONS: The patient is currently admitted to the hospital and receiving intravenous antibiotics. The antibiotics were administered within an appropriate time frame prior to the initiation of the procedure. ANESTHESIA/SEDATION: 2 mg IV Versed 100 mcg IV Fentanyl Moderate Sedation Time: 58 minutes The patient was continuously monitored during the procedure by the interventional radiology nurse under my direct supervision. COMPLICATIONS: None immediate. TECHNIQUE: Informed written consent was obtained from the patient after a thorough discussion of the procedural  risks, benefits and alternatives. All questions were addressed. Maximal Sterile Barrier Technique was utilized including caps, mask, sterile gowns, sterile gloves, sterile drape, hand hygiene and skin antiseptic. A timeout was performed prior to the initiation of the procedure. PROCEDURE: The operative field was prepped with Chlorhexidine in a sterile fashion, and a sterile drape was applied covering the operative field. A sterile gown and sterile gloves were used for the procedure. Local anesthesia was provided with 1% Lidocaine. Attention was first turned to the perihepatic fluid collection. CT imaging was performed. Local anesthesia was attained by infiltration with 1% lidocaine. A small dermatotomy was made. Under intermittent CT guidance, an 18 gauge trocar needle was carefully advanced into the perihepatic fluid collection. A 0.035 wire was then advanced into the collection. The tract was dilated to 55 [place]. A Cook 12 [place] all-purpose drainage catheter was advanced over the wire and formed. Aspiration yields copious viscous bilious fluid with internal debris. The catheter was connected to JP bulb suction and secured to the skin with 0 Prolene suture. Attention was next turned to the fluid and gas collection in the gallbladder fossa. Using similar technique, a suitable skin entry site was identified. Local anesthesia was attained by infiltration with 1% lidocaine. A small dermatotomy was made. A new 18 gauge trocar needle was carefully advanced along a short transhepatic tract and into the gallbladder fossa collection. A 0.035 wire was then formed in the collection. The percutaneous and transhepatic tract were dilated to 12 [place]. A 12 French all-purpose drainage catheter was then advanced over the wire and formed. Aspiration yields additional bilious fluid and debris. This drainage catheter was connected to JP bulb suction and secured to the skin with 0 Prolene suture. Finally, attention was turned to the  right abdominal wall intramuscular fluid collection. A suitable skin entry site was identified by CT imaging. Local anesthesia was again attained by infiltration with 1% lidocaine. A small dermatotomy was made. A third 18 gauge trocar needle was carefully advanced into the fluid collection. A new 0.035 wire was formed in the collection. The percutaneous tract was dilated to 14 [place]. A 14 French all-purpose drainage catheter was then  advanced over the wire and formed. Aspiration yields copious bilious fluid, old blood and debris. The drainage catheter was connected to JP bulb suction and secured to the skin with 0 Prolene suture. FINDINGS: Copious viscous bilious fluid and debris at all 3 drainage sites. IMPRESSION: 1. Successful placement of a total of 3 percutaneous drainage catheters (12 [place] into the perihepatic space, 12 French into the gallbladder fossa via a transhepatic course, 64 French into the right abdominal wall intramuscular collection). 2. Aspiration at all 3 sites yields over 500 mL of thick viscous bilious fluid and debris. Following aspiration, no further drainage of bilious fluid was identified from the previously placed surgical drain entry site. Electronically Signed   By: Jacqulynn Cadet M.D.   On: 08/30/2020 08:45    Labs:  CBC: Recent Labs    08/28/20 0500 08/29/20 0415 08/30/20 0500 08/31/20 0359  WBC 18.3* 28.2* 27.6* 19.2*  HGB 9.6* 8.8* 8.1* 8.0*  HCT 31.1* 27.4* 24.8* 24.3*  PLT 262 264 284 323    COAGS: No results for input(s): INR, APTT in the last 8760 hours.  BMP: Recent Labs    08/28/20 2125 08/29/20 0858 08/30/20 0500 08/31/20 0359  NA 130* 132* 133* 136  K 4.1 4.5 4.5 4.0  CL 99 102 102 107  CO2 20* 20* 20* 20*  GLUCOSE 190* 122* 84 168*  BUN 36* 38* 41* 20  CALCIUM 7.0* 7.4* 7.6* 7.6*  CREATININE 2.37* 2.48* 2.90* 1.04*  GFRNONAA 22* 20* 17* 58*    LIVER FUNCTION TESTS: Recent Labs    08/28/20 0500 08/29/20 0858 08/30/20 0500  08/31/20 0359  BILITOT 1.0 1.3* 2.1* 0.9  AST 30 31 29  32  ALT 28 25 23 22   ALKPHOS 102 123 97 92  PROT 4.6* 4.9* 5.1* 5.2*  ALBUMIN 1.2* 1.1* 1.5* 1.8*    Assessment and Plan:  Suspected gallbladder cancer S/p cholecystectomy with RUQ drain placement by Dr. Bobbye Morton (surgery) 08/21/20. She returned to the OR with Dr. 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 Laurence Ferrari  Drains # 3, 4 and 5 placed in IR. Per Epic, output is 640, 665 and 400 mls respectively. Upon assessment, each drain had 100, 100 and 50 mls in JP bulbs. IR drains were each flushed with 5 ml NS.   IR recommends to continue flushing drains and documenting daily output. Keep dressings clean and dry; change daily or as needed. Patient will need outpatient follow up with IR and an order has been placed.  Other plans per primary teams. IR will continue to follow.   Electronically Signed: Soyla Dryer, AGACNP-BC [phone] 08/31/2020, 3:25 PM   I spent a total of 25 Minutes at the the patient's bedside AND on the patient's hospital floor or unit, greater than 50% of which was counseling/coordinating care for GB fossa, perihepatic and abdominal wall drains (three drains).

## 2020-08-31 NOTE — Progress Notes (Signed)
Physical Therapy Treatment Patient Details Name: Faith Morgan MRN: [id number] DOB: 08/29/1950 Today's Date: 08/31/2020    History of Present Illness Pt is a 70 y/o female s/p lap chole due cholectystitis and necrotic gallbladder. 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 No pertinent PMH.    PT Comments    Pt hurting too much to work with pt.  Spent time with pt/dtr discussing tidbits of information to help with care of patient and encouraging pt to get up as frequently as possible to stay mobile and comfortable.   Follow Up Recommendations  Other (comment) (No PT follow up.  Pt home with hospice/services.)     Equipment Recommendations  None recommended by PT (managed by hospice in the home.)    Recommendations for Other Services       Precautions / Restrictions      Mobility  Bed Mobility                  Transfers                    Ambulation/Gait                 Stairs             Wheelchair Mobility    Modified Rankin (Stroke Patients Only)       Balance                                            Cognition     Overall Cognitive Status: Within Functional Limits for tasks assessed                                        Exercises      General Comments General comments (skin integrity, edema, etc.): In lieu of pt declining mobility, I discussed with daughter and pt about pt's present level of mobility.  Discussed  helpful information to help with mobility, hospital bed, large incontinence padding used to help roll and transition OOB.  Encouraged pt to get OOB and transfer to [facility] or get up in the chair and not fully rely on the "purewick" for day time.  We discussed appropriate seating for patien including lift chairs.      Pertinent Vitals/Pain      Home Living                       Prior Function            PT Goals (current goals can now be found in the care plan section) Acute Rehab PT Goals Patient Stated Goal: I'm hoping to get home tomorrow., Time For Goal Achievement: 09/04/20 Potential to Achieve Goals: Fair Progress towards PT goals: Not progressing toward goals - comment (home with hospice)    Frequency    Min 3X/week      PT Plan Current plan remains appropriate    Co-evaluation              AM-PAC PT "6 Clicks" Mobility   Outcome Measure  Help needed turning from your back to your side while in a flat bed without using bedrails?: A Little Help  needed moving from lying on your back to sitting on the side of a flat bed without using bedrails?: A Little Help needed moving to and from a bed to a chair (including a wheelchair)?: A Little Help needed standing up from a chair using your arms (e.g., wheelchair or bedside chair)?: A Little Help needed to walk in hospital room?: A Little Help needed climbing 3-5 steps with a railing? : A Lot 6 Click Score: 17    End of Session   Activity Tolerance: Patient limited by pain Patient left: in bed;with call bell/phone within reach;with family/visitor present Nurse Communication: Mobility status Pain - part of body:  (abdomen)     Time: 9024-0973 PT Time Calculation (min) (ACUTE ONLY): 13 min  Charges:  $Self Care/Home Management: 8-22                     08/31/2020  Faith Carne., PT Acute Rehabilitation Services [phone]  (pager) [phone]  (office)   Faith Morgan 08/31/2020, 4:00 PM

## 2020-08-31 NOTE — Progress Notes (Signed)
OT Cancellation Note  Patient Details Name: Faith Morgan MRN: [id number] DOB: 03-21-1951   Cancelled Treatment:    Reason Eval/Treat Not Completed: Patient declined, no reason specified;Other (comment) Per chart review from PT note and verbal communication with PT, pt not wanting to participate in therapy today. Will check back as time allows.   Corinne Ports K., COTA/L Acute Rehabilitation Services [phone]   Precious Haws 08/31/2020, 4:34 PM

## 2020-08-31 NOTE — Progress Notes (Signed)
PHARMACY NOTE:  ANTIMICROBIAL RENAL DOSAGE ADJUSTMENT  Current antimicrobial regimen includes a mismatch between antimicrobial dosage and estimated renal function.  As per policy approved by the Pharmacy & Therapeutics and Medical Executive Committees, the antimicrobial dosage will be adjusted accordingly.  Current antimicrobial dosage:  Zosyn 2.25g IV every 8 hours   Indication: intra-abdominal infection  Renal Function:  Estimated Creatinine Clearance: 55.2 mL/min (A) (by C-G formula based on SCr of 1.04 mg/dL (H)). []      On intermittent HD, scheduled: []      On CRRT    Antimicrobial dosage has been changed to:  Zosyn 3.375g IV every 8 hours (EI infusion)  Additional comments:   Thank you for allowing pharmacy to be a part of this patient's care.  Albertina Parr, PharmD., BCPS, BCCCP Clinical Pharmacist Please refer to [facility] for unit-specific pharmacist

## 2020-08-31 NOTE — Progress Notes (Signed)
Patients PCA restarted. Patients pain in now more controled and patient is resting well.

## 2020-08-31 NOTE — Progress Notes (Signed)
I called Faith Morgan per her request to discuss prognosis. Again Faith Morgan has confirmed that patient doesn't want any kind of chemotherapy or surgery We discussed that prognosis can be several months in general but again it will depend on many factors hence cannot predict with great certainty We discussed that hospice will assist with symptom management. She says all her questions were answered.

## 2020-08-31 NOTE — Care Management Important Message (Signed)
Important Message  Patient Details  Name: Faith Morgan MRN: [id number] Date of Birth: [birth year]   Medicare Important Message Given:  Yes     [name] Montine Circle 08/31/2020, 3:47 PM

## 2020-08-31 NOTE — Progress Notes (Signed)
PT Cancellation Note  Patient Details Name: Faith Morgan MRN: [id number] DOB: 15-Sep-1950   Cancelled Treatment:    Reason Eval/Treat Not Completed: Patient declined, no reason specified  08/31/2020  Ginger Carne., PT Acute Rehabilitation Services [phone]  (pager) [phone]  (office)   [name] 08/31/2020, 2:14 PM

## 2020-08-31 NOTE — Progress Notes (Signed)
[facility])  Necessary DME in place for patient to discharge home.  Plans to arrange for PCA for pain management are in process, this likely will not be able to be accomplished today.  Updated daughter, she understands current plan.  Discussed plan with attending, [facility] manager and PMT.   Please reach out with any questions or concerns.  Venia Carbon RN, BSN, [facility]  [phone] or epic chat

## 2020-08-31 NOTE — Progress Notes (Signed)
Daily Progress Note   Patient Name: Faith Morgan       Date: 08/31/2020 DOB: 1951-03-09  Age: 70 y.o. MRN#: [id number] Attending Physician: Jesusita Oka, MD Primary Care Physician: Kathyrn Lass, MD Admit Date: 08/21/2020  Reason for Consultation/Follow-up: Establishing goals of care  Subjective: Discussed case with Dr. Bobbye Morton, Venia Carbon, and Ihor Dow from the palliative care team.  I saw and examined Faith Morgan today.  We talked about her pain management.  She reports that she has been having uncontrolled pain but it is a little bit better since restarting PCA.  We discussed utilization of basal rate in addition to bolus dosing through PCA in order to hopefully better control her pain.  We also discussed plan to discharge home tomorrow with hospice as long as everything can be arranged.  Length of Stay: 8  Current Medications: Scheduled Meds:  . acetaminophen  1,000 mg Oral Q6H  . chlorhexidine  15 mL Mouth Rinse BID  . Chlorhexidine Gluconate Cloth  6 each Topical Daily  . enoxaparin (LOVENOX) injection  30 mg Subcutaneous Q24H  . feeding supplement  237 mL Oral TID BM  . free water  250 mL Per Tube Q3H  . HYDROmorphone   Intravenous Q4H  . insulin aspart  0-20 Units Subcutaneous Q4H  . mouth rinse  15 mL Mouth Rinse q12n4p  . methocarbamol  1,000 mg Oral Q8H  . pantoprazole (PROTONIX) IV  40 mg Intravenous Q12H  . sodium chloride flush  10-40 mL Intracatheter Q12H    Continuous Infusions: . sodium chloride 75 mL/hr at 08/31/20 2125  . feeding supplement (VITAL 1.5 CAL) 1,000 mL (08/31/20 2049)  . piperacillin-tazobactam (ZOSYN)  IV 3.375 g (08/31/20 2041)    PRN Meds: bisacodyl, busPIRone, diphenhydrAMINE **OR** diphenhydrAMINE, HYDROmorphone (DILAUDID)  injection, metoprolol tartrate, naloxone **AND** sodium chloride flush, ondansetron (ZOFRAN) IV, oxyCODONE, sodium chloride flush, sodium chloride flush  Physical Exam Vitals and nursing note reviewed.  Constitutional:      General: She is awake.  HENT:     Head: Normocephalic and atraumatic.  Cardiovascular:     Rate and Rhythm: Normal rate.  Pulmonary:     Effort: No tachypnea, accessory muscle usage or respiratory distress.  Abdominal:     Comments: G/J tubes, JP, wound vac  Skin:  General: Skin is warm and dry.  Neurological:     Mental Status: She is alert and oriented to person, place, and time.  Psychiatric:        Mood and Affect: Mood normal.        Speech: Speech normal.        Behavior: Behavior normal.        Cognition and Memory: Cognition normal.             Vital Signs: BP 139/63 (BP Location: Left Arm)   Pulse (!) 103   Temp 98.3 F (36.8 C) (Oral)   Resp 20   Ht 5\' 1"  (1.549 m)   Wt 102.1 kg   SpO2 97%   BMI 42.53 kg/m  SpO2: SpO2: 97 % O2 Device: O2 Device: Room Air O2 Flow Rate: O2 Flow Rate (L/min): 0 L/min  Intake/output summary:   Intake/Output Summary (Last 24 hours) at 08/31/2020 2237 Last data filed at 08/31/2020 1944 Gross per 24 hour  Intake 2128.37 ml  Output 5030 ml  Net -2901.63 ml   LBM: Last BM Date: 08/30/20 Baseline Weight: Weight: 80.7 kg Most recent weight: Weight: 102.1 kg       Palliative Assessment/Data: PPS 40%    Flowsheet Rows   Flowsheet Row Most Recent Value  Intake Tab   Referral Department Trauma  Unit at Time of Referral Intermediate Care Unit  Palliative Care Primary Diagnosis Cancer  Palliative Care Type New Palliative care  Reason for referral Clarify Goals of Care  Date first seen by Palliative Care 08/27/20  Clinical Assessment   Palliative Performance Scale Score 40%  Psychosocial & Spiritual Assessment   Palliative Care Outcomes   Patient/Family meeting held? Yes  Who was at the meeting?  patient and daughter  Palliative Care Outcomes Clarified goals of care, Counseled regarding hospice, Provided end of life care assistance, Provided advance care planning, Provided psychosocial or spiritual support, Changed CPR status, Completed durable DNR, ACP counseling assistance, Transitioned to hospice      Patient Active Problem List   Diagnosis Date Noted  . Abdominal pain   . Adenocarcinoma ([place])   . Palliative care by specialist   . DNR (do not resuscitate)   . S/P cholecystectomy 08/21/2020  . Goals of care, counseling/discussion 08/21/2020    Palliative Care Assessment & Plan   Patient Profile: 70 y.o. female  with no significant past medical history admitted on 08/21/2020 with right upper quadrant pain with outpatient HIDA scan revealing cholecystitis. Admitted for lap chole by Dr. Bobbye Morton. 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 Pathology revealed poorly differentiated adenocarcinoma with focal squamous metaplasia in gallbladder. CT and MRI completed. MRI highly suspicious for metastatic spread to liver, also ? Abscess. Oncology consulted and patient is very clear on her decision against chemotherapy or surgical intervention. She wishes to discharge home with hospice. Palliative medicine consultation for goals of care.   Assessment: S/p lap subtotal cholecystectomy for necrotic cholecystitis S/p exlap, GJ, g-tube, j-tube, JP Adenocarcinoma, presumably gallbladder cancer  Recommendations/Plan: Discussed with Dr. Bobbye Morton as well as Venia Carbon from hospice.  Plan moving forward will be home with hospice with PCA for pain management. I saw and examined Faith Morgan, reviewed MAR, and discussed pain management with her. At time of discharge, I would recommend Dilaudid CADD with 0.2 mg basal rate with 0.5 mg bolus dosing  with 20-minute lockout. I  changed her current PCA settings to reflect this in order to ensure that no further adjustments need to be made prior to her discharge. Prescription written for home PCA and left on shadow chart for infusion pharmacy representative for pickup. Please call if there are other acute needs with which we can be of assistance.   Code Status: DNR/DNI   Code Status Orders  (From admission, onward)         Start     Ordered   08/27/20 1255  Do not attempt resuscitation (DNR)  Continuous       Question Answer Comment  In the event of cardiac or respiratory ARREST Do not call a "code blue"   In the event of cardiac or respiratory ARREST Do not perform Intubation, CPR, defibrillation or ACLS   In the event of cardiac or respiratory ARREST Use medication by any route, position, wound care, and other measures to relive pain and suffering. May use oxygen, suction and manual treatment of airway obstruction as needed for comfort.      08/27/20 1254        Code Status History    Date Active Date Inactive Code Status Order ID Comments User Context   08/21/2020 1259 08/27/2020 1254 Full Code [id number]  Jesusita Oka, MD Inpatient   Advance Care Planning Activity    Advance Directive Documentation   Flowsheet Row Most Recent Value  Type of Advance Directive Healthcare Power of Attorney, Living will  Pre-existing out of facility DNR order (yellow form or pink MOST form) --  "MOST" Form in Place? --      Prognosis:  Poor prognosis with suspicion for metastatic gallbladder cancer. Patient wishes to forgo aggressive oncology workup/chemotherapy/surgery.   Discharge Planning: Home with Hospice  Care plan was discussed with RN, daughter, patient  Thank you for allowing the Palliative Medicine Team to assist in the care of this patient.   Total Time 45 Prolonged Time Billed  no   Greater than 50%  of this time was spent counseling and coordinating care related to the  above assessment and plan.  Micheline Rough, MD [place] Team [phone]  Please contact Palliative Medicine Team phone at [phone] for questions and concerns.

## 2020-09-01 ENCOUNTER — Other Ambulatory Visit (HOSPITAL_COMMUNITY): Payer: Self-pay | Admitting: Surgery

## 2020-09-01 DIAGNOSIS — Z743 Need for continuous supervision: Secondary | ICD-10-CM | POA: Diagnosis not present

## 2020-09-01 DIAGNOSIS — Z7401 Bed confinement status: Secondary | ICD-10-CM | POA: Diagnosis not present

## 2020-09-01 DIAGNOSIS — M2550 Pain in unspecified joint: Secondary | ICD-10-CM | POA: Diagnosis not present

## 2020-09-01 DIAGNOSIS — R5381 Other malaise: Secondary | ICD-10-CM | POA: Diagnosis not present

## 2020-09-01 LAB — CBC
HCT: 21.2 % — ABNORMAL LOW (ref 36.0–46.0)
Hemoglobin: 7.1 g/dL — ABNORMAL LOW (ref 12.0–15.0)
MCH: 31.1 pg (ref 26.0–34.0)
MCHC: 33.5 g/dL (ref 30.0–36.0)
MCV: 93 fL (ref 80.0–100.0)
Platelets: 331 10*3/uL (ref 150–400)
RBC: 2.28 MIL/uL — ABNORMAL LOW (ref 3.87–5.11)
RDW: 14.5 % (ref 11.5–15.5)
WBC: 14.4 10*3/uL — ABNORMAL HIGH (ref 4.0–10.5)
nRBC: 0 % (ref 0.0–0.2)

## 2020-09-01 LAB — GLUCOSE, CAPILLARY
Glucose-Capillary: 110 mg/dL — ABNORMAL HIGH (ref 70–99)
Glucose-Capillary: 110 mg/dL — ABNORMAL HIGH (ref 70–99)
Glucose-Capillary: 114 mg/dL — ABNORMAL HIGH (ref 70–99)
Glucose-Capillary: 121 mg/dL — ABNORMAL HIGH (ref 70–99)
Glucose-Capillary: 123 mg/dL — ABNORMAL HIGH (ref 70–99)
Glucose-Capillary: 133 mg/dL — ABNORMAL HIGH (ref 70–99)

## 2020-09-01 LAB — COMPREHENSIVE METABOLIC PANEL
ALT: 24 U/L (ref 0–44)
AST: 29 U/L (ref 15–41)
Albumin: 1.3 g/dL — ABNORMAL LOW (ref 3.5–5.0)
Alkaline Phosphatase: 90 U/L (ref 38–126)
Anion gap: 11 (ref 5–15)
BUN: 15 mg/dL (ref 8–23)
CO2: 18 mmol/L — ABNORMAL LOW (ref 22–32)
Calcium: 6.4 mg/dL — CL (ref 8.9–10.3)
Chloride: 108 mmol/L (ref 98–111)
Creatinine, Ser: 0.89 mg/dL (ref 0.44–1.00)
GFR, Estimated: >60 mL/min (ref >60)
Glucose, Bld: 122 mg/dL — ABNORMAL HIGH (ref 70–99)
Potassium: 3.5 mmol/L (ref 3.5–5.1)
Sodium: 137 mmol/L (ref 135–145)
Total Bilirubin: 0.9 mg/dL (ref 0.3–1.2)
Total Protein: 5.4 g/dL — ABNORMAL LOW (ref 6.5–8.1)

## 2020-09-01 MED ORDER — CALCIUM GLUCONATE-NACL 1-0.675 GM/50ML-% IV SOLN
1.0000 g | Freq: Once | INTRAVENOUS | Status: AC
  Administered 2020-09-01: 1000 mg via INTRAVENOUS
  Filled 2020-09-01: qty 50

## 2020-09-01 MED ORDER — BISACODYL 10 MG RE SUPP: 10.0000 mg | Freq: Every day | RECTAL | 0 refills | Status: DC | PRN

## 2020-09-01 MED ORDER — ACETAMINOPHEN 500 MG PO TABS: 1000.0000 mg | ORAL_TABLET | Freq: Four times a day (QID) | ORAL | 0 refills | Status: DC

## 2020-09-01 MED ORDER — ONDANSETRON HCL 4 MG PO TABS: 4.0000 mg | ORAL_TABLET | Freq: Three times a day (TID) | ORAL | 2 refills | Status: DC | PRN

## 2020-09-01 MED ORDER — VITAL 1.5 CAL PO LIQD: 1000.0000 mL | ORAL | 12 refills | Status: DC

## 2020-09-01 MED ORDER — METHOCARBAMOL 500 MG PO TABS: 1000.0000 mg | ORAL_TABLET | Freq: Three times a day (TID) | ORAL | 2 refills | Status: DC

## 2020-09-01 MED ORDER — BUSPIRONE HCL 7.5 MG PO TABS: 7.5000 mg | ORAL_TABLET | Freq: Three times a day (TID) | ORAL | 1 refills | Status: AC | PRN

## 2020-09-01 MED ORDER — ENSURE ENLIVE PO LIQD: 237.0000 mL | Freq: Three times a day (TID) | ORAL | 12 refills | Status: DC

## 2020-09-01 MED ORDER — OXYCODONE HCL 5 MG PO TABS
5.0000 mg | ORAL_TABLET | ORAL | 0 refills | Status: DC | PRN
Start: 2020-09-01 — End: 2020-09-01

## 2020-09-01 MED ORDER — DIPHENHYDRAMINE HCL 12.5 MG/5ML PO ELIX: 12.5000 mg | ORAL_SOLUTION | Freq: Four times a day (QID) | ORAL | 0 refills | Status: AC | PRN

## 2020-09-01 MED ORDER — OXYCODONE HCL 5 MG PO TABS: 5.0000 mg | ORAL_TABLET | ORAL | 0 refills | Status: DC | PRN

## 2020-09-01 MED ORDER — NALOXONE HCL 0.4 MG/ML IJ SOLN: 0.4000 mg | INTRAMUSCULAR | 6 refills | Status: AC | PRN

## 2020-09-01 MED FILL — ONDANSETRON HCL 4 MG TABLET: 4 | 30 days supply | Qty: 90 | Fill #0

## 2020-09-01 MED FILL — BISACODYL 10 MG SUPP: 10 | 12 days supply | Qty: 12 | Fill #0

## 2020-09-01 MED FILL — SM ALLERGY RELIEF 12.5 MG/5: 12.5 | 11 days supply | Qty: 237 | Fill #0

## 2020-09-01 MED FILL — ACETAMINOPHEN 500MG XT STRE: 500 | 3 days supply | Qty: 30 | Fill #0

## 2020-09-01 MED FILL — oxyCODONE HCL 5 MG TABS: 5 | 10 days supply | Qty: 60 | Fill #0

## 2020-09-01 NOTE — Progress Notes (Signed)
OT Cancellation Note  Patient Details Name: Riane Rung MRN: [id number] DOB: [birth year]   Cancelled Treatment:    Reason Eval/Treat Not Completed: Patient declined, no reason specified;Other (comment) pt reports having just gotten comfortable in bed declining OT session. Will check back as time allows for OT session.   Harley Alto., COTA/L Acute Rehabilitation Services [phone]   Precious Haws 09/01/2020, 10:33 AM

## 2020-09-01 NOTE — TOC Transition Note (Addendum)
Transition of Care [facility]) - CM/SW Discharge Note   Patient Details  Name: Corean Yoshimura MRN: [id number] Date of Birth: [birth year]  [facility] CM/SW Contact:  Ella Bodo, RN Phone Number: 09/01/2020,   Clinical Narrative: Pt is a 70 y/o female s/p lap chole due cholectystitis and necrotic gallbladder. 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  Plan for discharge home with Hospice services today, provided by [employer].  PCA pump delivered at 1635 to bedside for home use; [facility] liaison on floor to connect pt to pump.  Notified [facility] service that pt will require stretcher transport to home.  Chris with transport service to notify [facility] CM with ETA for transport.   Addendum: 1715pm [facility] states [facility] to pick up patient between 6 and 6:30.  Notified MD, bedside nurse, [facility] liaison, and daughter. ([facility] after hours number [phone]; please call if any delays)        Final next level of care: Home w Hospice Care Barriers to Discharge: Barriers resolved  Patient Goals and CMS Choice Patient states their goals for this hospitalization and ongoing recovery are:: to go home CMS Medicare.gov Compare Post Acute Care list provided to:: Patient Choice offered to / list presented to : [place]                        Discharge Plan and Services     Post Acute Care Choice: Hospice          DME Arranged: Other see comment (PCA) DME Agency: [facility] of ,Other - Comment Date DME Agency Contacted: 08/31/20 Time DME Agency Contacted: 0800 Representative spoke with at DME Agency: Carolynn Sayers [facility] Arranged: RN,Social Work [facility] Agency: Sandi Mealy and [place] Date [place]: 08/31/20 Time [place]: 0800 Representative spoke with at [place]: [place] ([place]) Interventions     Readmission Risk Interventions Readmission Risk Prevention Plan 09/01/2020  Transportation Screening Complete  PCP or Specialist Appt within 5-7 Days Complete  Home Care Screening Complete  Medication Review (RN CM) Complete  Some recent data might be hidden   Reinaldo Raddle, RN, BSN  Trauma/Neuro ICU Case Manager [phone]

## 2020-09-01 NOTE — Progress Notes (Signed)
Medication from [place] given to Daughter, Oxycodone included in the medication. CADD pump arrived, set up via Hospice and connected to pt. Foley inserted for comfort per family and pt request. Dr. Bobbye Morton in agreement. Awaiting transport.

## 2020-09-01 NOTE — Progress Notes (Signed)
CRITICAL VALUE ALERT  Critical Value:  Calcium 6.4  Date & Time Notied:  09/01/20 0964  Provider Notified: Dr. Grandville Silos  Orders Received/Actions taken: Order received for calcium gluconate

## 2020-09-02 NOTE — Progress Notes (Signed)
EMS has arrived to pick up patient to go home with hospice. Family already aware of patient coming. On call hospice nurse notified. Report given to transporters. Patient supplies already sent home during day shift. Transporter aware CADD PCA pump, going with patient.

## 2020-09-08 NOTE — Discharge Summary (Signed)
Patient ID: Faith Morgan [id number] [birth year] 70 y.o.  Admit date: 08/21/2020 Discharge date: 09/08/2020  Admitting Diagnosis: cholecystitis  Discharge Diagnosis Patient Active Problem List   Diagnosis Date Noted  . Abdominal pain   . Adenocarcinoma ([place])   . Palliative care by specialist   . DNR (do not resuscitate)   . S/P cholecystectomy 08/21/2020  . Goals of care, counseling/discussion 08/21/2020    Consultants Oncology, Palliative Care  Reason for Admission: Cholecystitis, cholecystectomy  Procedures Subtotal cholecystectomy  Hospital Course:  1F with HIDA suggestive of acute cholecystitis. Taken to OR for lap chole, extensive inflammation, fenestration of the gallbladder, specimen sent to path significant for adenocarcinoma. Takeback for bile leak, noted to have a free perforation of the duodenum, underwent pyloric exclusion, GJ, g-tube placement, j-tube placement. Underwent post-op additional drain placement. Onc and palliative c/s, patient declining surgery.   Physical Exam: Gen: comfortable, no distress Neuro: non-focal exam HEENT: PERRL Neck: supple CV: RRR Pulm: unlabored breathing Abd: soft, appropriately TTP, JP x5, midline wtd, g-tube clamped, j-tube feeds GU: clear yellow urine Extr: wwp, no edema   Allergies as of 09/01/2020      Reactions   Crestor [rosuvastatin] Other (See Comments)   Muscle pain      Medication List    STOP taking these medications   Dialyvite Vitamin D 5000 125 MCG (5000 UT) capsule Generic drug: Cholecalciferol     TAKE these medications   acetaminophen 500 MG tablet Commonly known as: TYLENOL Take 2 tablets (1,000 mg total) by mouth every 6 (six) hours.   bisacodyl 10 MG suppository Commonly known as: DULCOLAX Place 1 suppository (10 mg total) rectally daily as needed for moderate constipation.   busPIRone 7.5 MG tablet Commonly known as: BUSPAR Place 1 tablet (7.5 mg total) into feeding tube 3  (three) times daily as needed (anxiety).   diphenhydrAMINE 12.5 MG/5ML elixir Commonly known as: BENADRYL Take 5 mLs (12.5 mg total) by mouth every 6 (six) hours as needed for itching.   feeding supplement Liqd Take 237 mLs by mouth 3 (three) times daily between meals.   feeding supplement (VITAL 1.5 CAL) Liqd Place 1,000 mLs into feeding tube continuous.   HYDROmorphone 1 mg/mL injection Commonly known as: DILAUDID Hydromorphone PCA/CADD  Basal rate: 0.2mg  per hour Bolus dose: 0.5 mg Lockout: 20 minutes  One hour limit: 1.7mg   Please dispense 500mg  of IV dialudid (12 day supply).   Concentration and dispense volume to be determined by pharmacy.  Hospice patient.   Further refills and titration per hospice attending. Terminal diagnosis:  poorly differentiated adenocarcinoma, necrotic cholecystitis, cancer related pain   methocarbamol 500 MG tablet Commonly known as: ROBAXIN Take 2 tablets (1,000 mg total) by mouth every 8 (eight) hours.   multivitamin with minerals Tabs tablet Take 1 tablet by mouth daily.   naloxone 0.4 MG/ML injection Commonly known as: NARCAN Inject 1 mL (0.4 mg total) into the vein as needed (respiratory rate < 10).   ondansetron 4 MG tablet Commonly known as: Zofran Take 1 tablet (4 mg total) by mouth every 8 (eight) hours as needed for nausea or vomiting.   oxyCODONE 5 MG immediate release tablet Commonly known as: Oxy IR/ROXICODONE Take 1-2 tablets (5-10 mg total) by mouth every 4 (four) hours as needed for moderate pain or severe pain (5mg  for moderate pain, 10mg  for severe pain).         Follow-up Information    [place] Follow up.  Why: will need outpatient follow up in 2-3 weeks. A scheduler from our office will call you to arrange a date/time. Please call our office if you have any questions prior to your visit.  Contact information: [place] [phone]                 Signed: Jesusita Oka, [facility] 09/08/2020, 7:12 PM

## 2020-09-29 DEATH — deceased

## 2021-12-16 IMAGING — MR MR ABDOMEN WO/W CM
9 of 18 series · 21 of 48 positions shown · IV contrast (G GAD)
Comparison: CT chest 08/25/2020.  Abdomen/pelvis CT 08/23/2020.

CLINICAL DATA: Adenocarcinoma of the gallbladder. Status post
cholecystectomy. Staging.

EXAM:
MRI ABDOMEN WITHOUT AND WITH CONTRAST
TECHNIQUE: Multiplanar multisequence MR imaging of the abdomen was performed
both before and after the administration of intravenous contrast.
CONTRAST:  8mL GADAVIST GADOBUTROL 1 MMOL/ML IV SOLN

[Series 3: cor ssfse nav · coronal · 6.0mm · 0.78mm/px · 2 of 34 slices shown]
[im 1/34]
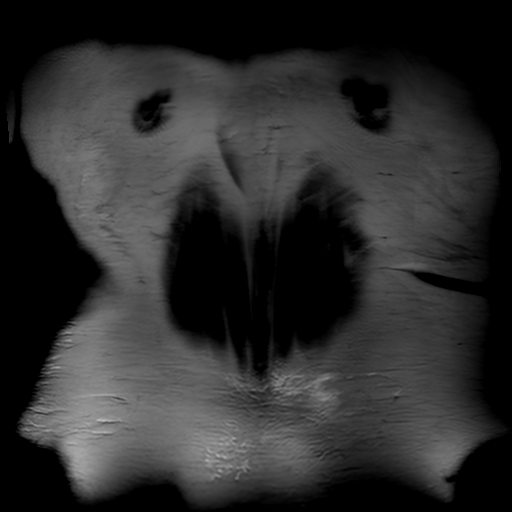
[im 34/34]
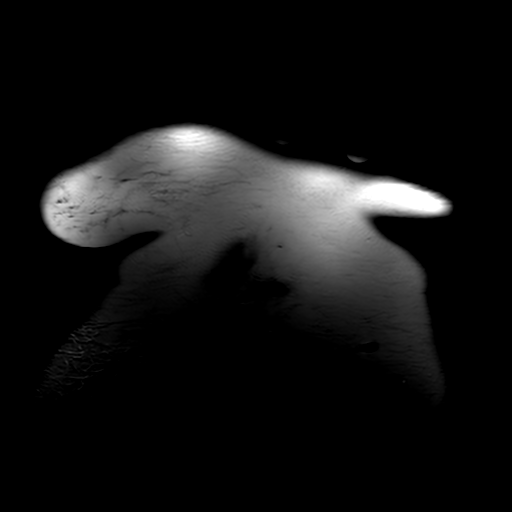

[Series 4: ax ssfse nav · axial · 6.0mm · 0.74mm/px · z∈[-115,+77]mm · 2 of 33 slices shown]
[im 1/33]
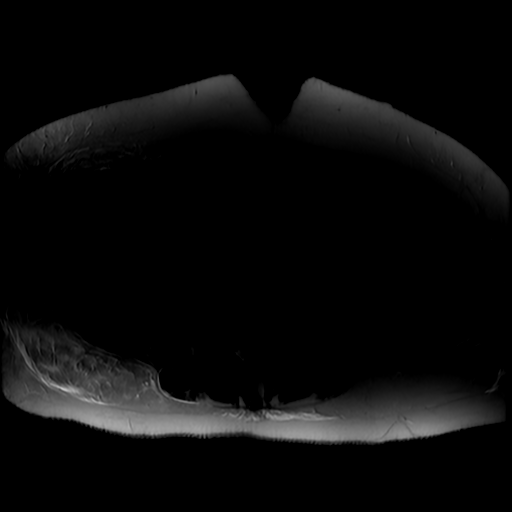
[im 33/33]
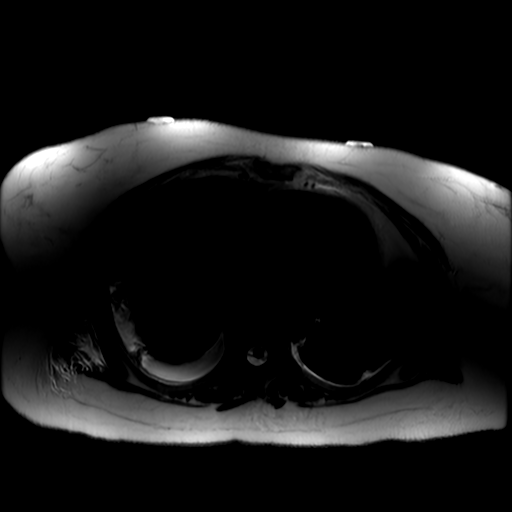

[Series 5: T2 fat-sat · axial · 6.0mm · 0.74mm/px · 1 of 33 slices shown]
[im 1/33]
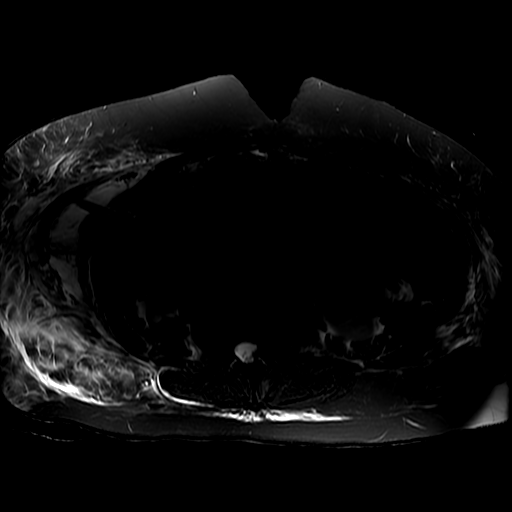

[Series 6: DWI b500 · axial · 8.0mm · 1.76mm/px · z∈[-146,+104]mm · 2 of 52 slices shown]
[im 1/52]
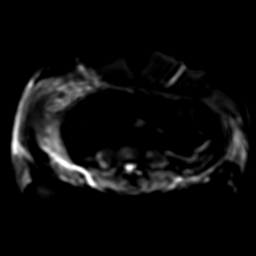
[im 52/52]
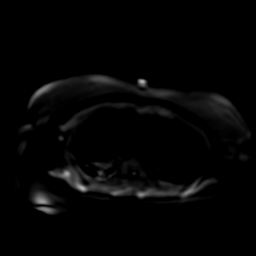

[Series 10: T1 dynamic · coronal · 3.4mm · 1.56mm/px · 4 of 120 slices shown]
[im 1/120]
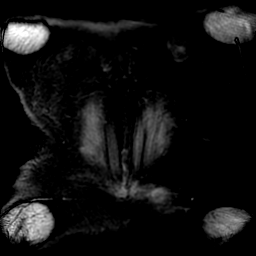
[im 40/120]
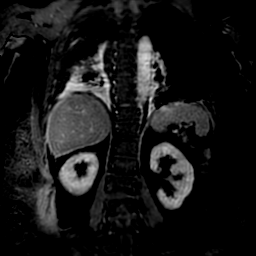
[im 80/120]
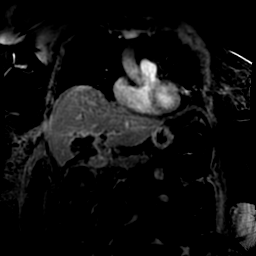
[im 120/120]
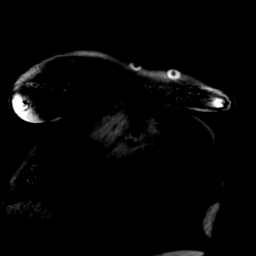

[Series 650: ADC · axial · 8.0mm · 1.76mm/px · 1 of 26 slices shown]
[im 1/26]
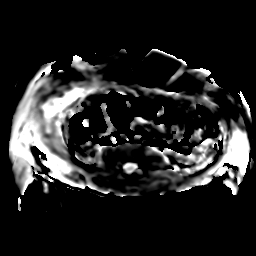

[Series 800: T1 dynamic post-contrast · axial · non-contrast · 4.0mm · 0.86mm/px · z∈[-142,+88]mm · 3 of 116 slices shown (1 of 3)]
[im 1/116]
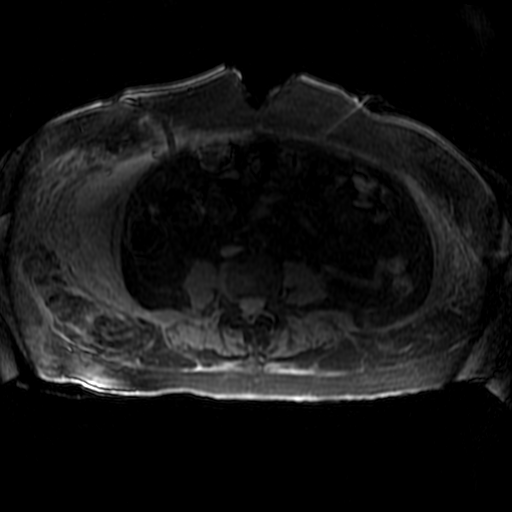
[im 58/116]
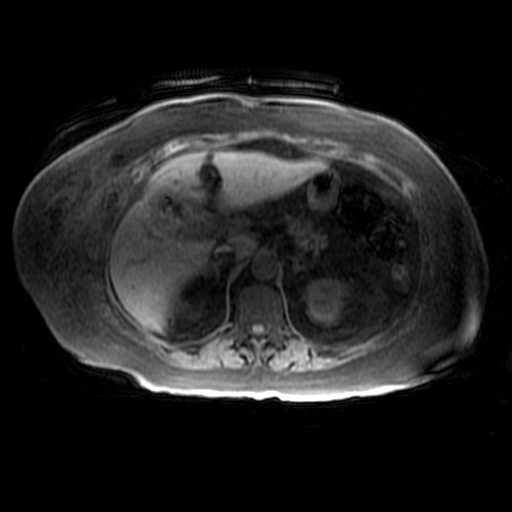
[im 116/116]
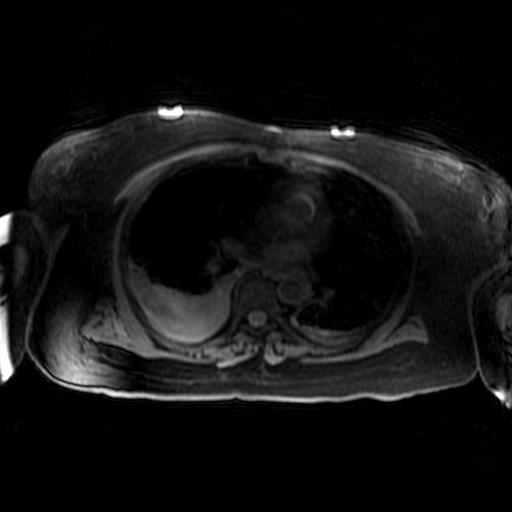

[Series 801: T1 dynamic post-contrast · axial · non-contrast · 4.0mm · 0.86mm/px · z∈[-142,+88]mm · 3 of 116 slices shown (2 of 3)]
[im 1/116]
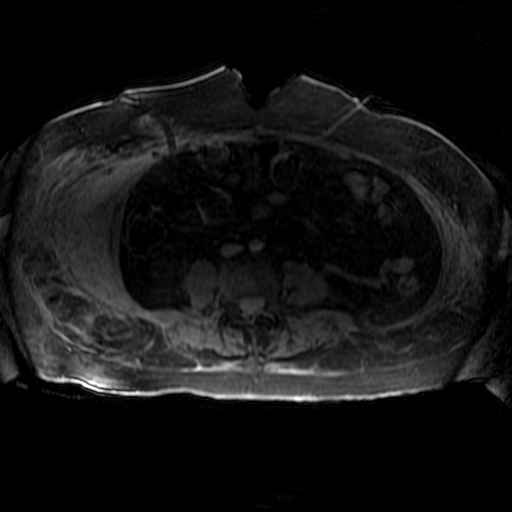
[im 58/116]
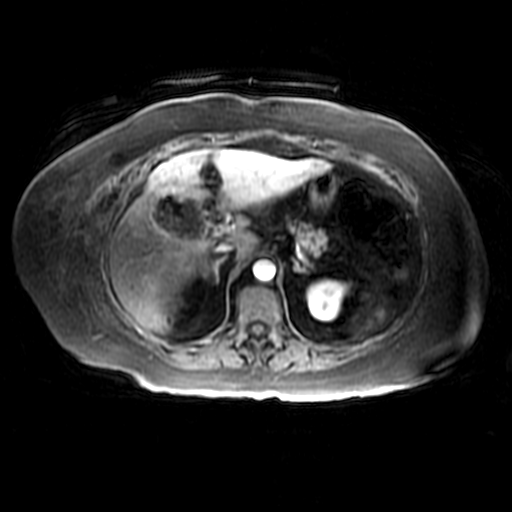
[im 116/116]
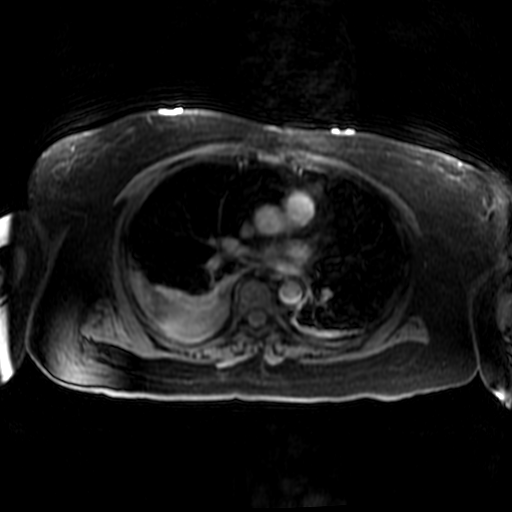

[Series 802: T1 dynamic post-contrast · axial · non-contrast · 4.0mm · 0.86mm/px · z∈[-142,+88]mm · 3 of 116 slices shown (3 of 3)]
[im 1/116]
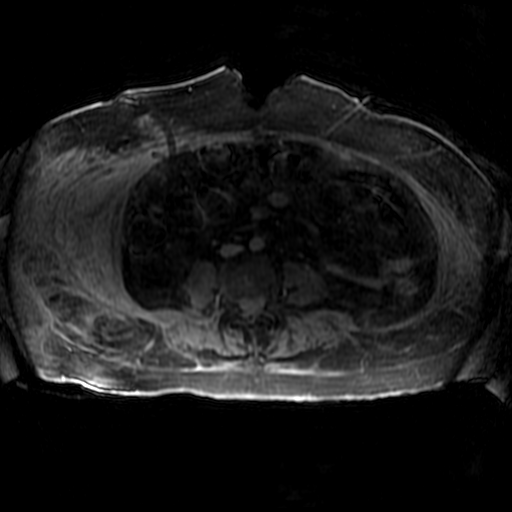
[im 58/116]
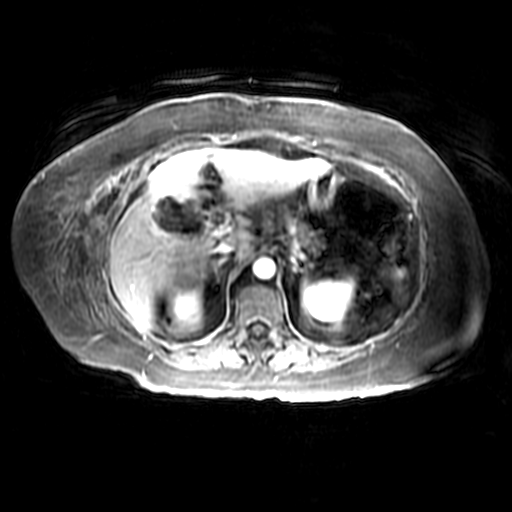
[im 116/116]
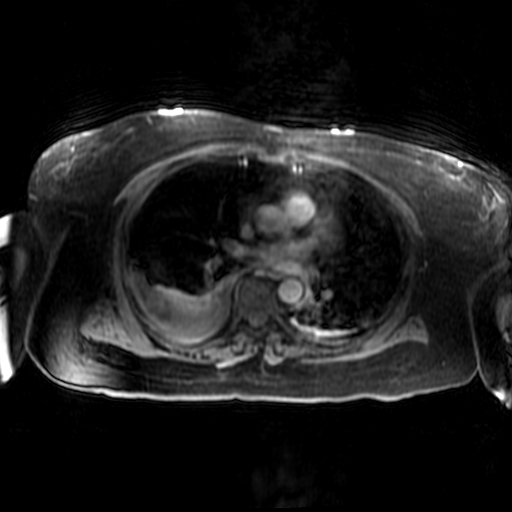

[21 of 48 positions shown; findings below may reference images not displayed]

FINDINGS: Motion degraded exam.

Lower chest: Small bilateral pleural effusions with bibasilar
collapse/consolidation in the lower lobes.

Hepatobiliary: Markedly heterogeneous liver perfusion. Irregular rim
enhancing lesion seen along the cranial aspect of the gallbladder
fossa measuring approximately 5.2 x 4.5 cm. This appears centrally
necrotic and diffusion imaging shows restricted diffusion in the
area of irregular rim enhancement. Just anterior to this, along the
falciform ligament is a 2.1 cm enhancing lesion in segment IV. This
T2 hyperintense lesion is well visualized on axial T2 image 17 of
series 5 and restricts diffusion, highly suggestive of metastatic
involvement.

9 mm T2 intermediate hyperintensity lesion in the caudate lobe just
anteromedial to the IVC (T2 image [DATE]) restricts diffusion and is
suspicious for metastatic involvement. This lesion appears to have
central necrosis with peripheral rim enhancement on postcontrast
imaging.

Dilatation of bile ducts in the left and right liver evident with
the site of obstruction noted at the confluence which is contiguous
with the abnormal enhancing tissue along the gallbladder fossa. This
tissue markedly attenuates the duct just distal to the confluence
where it measures only 1-2 mm in diameter (see axial T2 image 17 of
series 5). Coronal SS

Common bile duct in the head of the pancreas is nondilated. FSE
image [DATE] shows the irregular soft tissue involving the left and
right hepatic ducts and common duct distal to the confluence.

Pancreas: Multiple cystic foci are identified in the tip of the
pancreatic tail, not well evaluated on postcontrast imaging due to
motion degradation but no substantial soft tissue component evident.

Spleen:  No splenomegaly. No focal mass lesion.

Adrenals/Urinary Tract: No adrenal nodule or mass. Central sinus
cysts noted in the kidneys bilaterally.

Stomach/Bowel: Gastrostomy tube noted in the stomach which is
decompressed. No small bowel distention within the visualized
abdomen. Mild gaseous distention of colon evident.

Vascular/Lymphatic: No abdominal aortic aneurysm. Portal vein and
superior mesenteric vein are patent. Splenic vein difficult to
visualized due to motion degradation but does appear patent. 10 mm
short axis lymph node identified in the hepatoduodenal ligament.

Other:  No substantial intraperitoneal free fluid.

Musculoskeletal: Extensive edema and inflammation noted in the right
abdominal wall including a rim enhancing collection in the right
lateral abdominal wall musculature similar to CT scan from 2 days
ago. Right abdominal drain is not well demonstrated by MRI. No focal
suspicious marrow enhancement within the visualized bony anatomy.
IMPRESSION: 1. 5.2 x 4.5 cm irregular rim enhancing lesion along the cranial
aspect of the gallbladder fossa shows restricted diffusion and is
highly suggestive of tumor spread into the adjacent liver along the
gallbladder fossa given the history of adenocarcinoma. Abscess is
considered less likely but not entirely excluded. 2.1 cm enhancing
lesion in segment IV and 9 mm lesion in the caudate lobe are
compatible with metastatic involvement.
2. Dilatation of the bile ducts in the left and right liver
secondary to involvement by the lesion along the gallbladder fossa
where the ducts become obliterated just proximal to and at the level
of the hepatic duct confluence. This tissue markedly attenuates the
intrahepatic segment of the common duct just distal to the
confluence. No dilatation of the common bile duct noted in the head
of the pancreas.
3. 10 mm short axis lymph node in the hepatoduodenal ligament,
indeterminate but suspicious for metastatic disease. Attention on
follow-up recommended.
4. Extensive edema and inflammation in the right abdominal wall
including a rim enhancing collection in the right lateral abdominal
wall musculature compatible with abscess.
5. Previous abdomen/pelvis CT raised the question of fistula from
the duodenum. Gas is not well evaluated on MRI and given the
substantial motion degradation of today's study, assessment for
fistula is limited.
6. Complex cystic lesion in the tail of pancreas without substantial
enhancing component. Likely benign. Close attention on follow-up
recommended.
7. Small bilateral pleural effusions with bibasilar
collapse/consolidation in the lower lobes.
